# Patient Record
Sex: Female | Born: 1965
Health system: Southern US, Community
[De-identification: ages and names within clinical notes are randomized; demographics above are authoritative.]

## PROBLEM LIST (undated history)

## (undated) DIAGNOSIS — I10 Essential (primary) hypertension: Secondary | ICD-10-CM

## (undated) DIAGNOSIS — E119 Type 2 diabetes mellitus without complications: Secondary | ICD-10-CM

## (undated) DIAGNOSIS — F419 Anxiety disorder, unspecified: Secondary | ICD-10-CM

## (undated) HISTORY — PX: BACK SURGERY: SHX140

## (undated) HISTORY — PX: BREAST SURGERY: SHX581

---

## 2003-03-24 ENCOUNTER — Encounter: Payer: Self-pay | Admitting: Family Medicine

## 2008-03-24 ENCOUNTER — Encounter: Payer: Self-pay | Admitting: Family Medicine

## 2008-04-04 ENCOUNTER — Encounter: Payer: Self-pay | Admitting: Family Medicine

## 2009-01-15 ENCOUNTER — Encounter: Payer: Self-pay | Admitting: Family Medicine

## 2009-01-15 ENCOUNTER — Other Ambulatory Visit: Admission: RE | Admit: 2009-01-15 | Discharge: 2009-01-15 | Payer: Self-pay | Admitting: Family Medicine

## 2009-01-15 ENCOUNTER — Ambulatory Visit: Payer: Self-pay | Admitting: Family Medicine

## 2009-01-15 DIAGNOSIS — F329 Major depressive disorder, single episode, unspecified: Secondary | ICD-10-CM | POA: Insufficient documentation

## 2009-01-16 ENCOUNTER — Encounter: Payer: Self-pay | Admitting: Family Medicine

## 2009-01-16 LAB — CONVERTED CEMR LAB
ALT: 16 units/L (ref 0–35)
AST: 15 units/L (ref 0–37)
Albumin: 4.2 g/dL (ref 3.5–5.2)
Calcium: 9.6 mg/dL (ref 8.4–10.5)
Chloride: 103 meq/L (ref 96–112)
Cholesterol, target level: 200 mg/dL
HDL goal, serum: 40 mg/dL
Potassium: 4.5 meq/L (ref 3.5–5.3)
TSH: 4.258 microintl units/mL (ref 0.350–4.500)
Total CHOL/HDL Ratio: 4

## 2009-04-25 ENCOUNTER — Ambulatory Visit: Payer: Self-pay | Admitting: Family Medicine

## 2009-04-25 ENCOUNTER — Encounter: Admission: RE | Admit: 2009-04-25 | Discharge: 2009-04-25 | Payer: Self-pay | Admitting: Family Medicine

## 2009-04-25 DIAGNOSIS — M25519 Pain in unspecified shoulder: Secondary | ICD-10-CM | POA: Insufficient documentation

## 2009-09-03 ENCOUNTER — Ambulatory Visit: Payer: Self-pay | Admitting: Family Medicine

## 2009-09-03 LAB — CONVERTED CEMR LAB
Ketones, urine, test strip: NEGATIVE
pH: 6

## 2009-09-04 ENCOUNTER — Encounter: Payer: Self-pay | Admitting: Family Medicine

## 2010-01-03 ENCOUNTER — Ambulatory Visit: Payer: Self-pay | Admitting: Family Medicine

## 2010-01-03 DIAGNOSIS — D239 Other benign neoplasm of skin, unspecified: Secondary | ICD-10-CM | POA: Insufficient documentation

## 2010-10-01 NOTE — Letter (Signed)
Summary: [facility]  [facility]   Imported By: Lanelle Bal 09/07/2009 08:46:55  _____________________________________________________________________  External Attachment:    Type:   Image     Comment:   External Document

## 2010-10-01 NOTE — Assessment & Plan Note (Signed)
Summary: REcurrent UTI   Vital Signs:  Patient profile:   45 year old female Menstrual status:  regular Height:      69 inches Weight:      217 pounds Temp:     97.9 degrees F oral Pulse rate:   117 / minute BP sitting:   124 / 74  (left arm) Cuff size:   regular  Vitals Entered By: Kathlene November (September 03, 2009 1:54 PM) CC: urinary frequency, pressure, lower pelvic pain on and off for 1 month now.   Primary Care Provider:  Nani Gasser MD  CC:  urinary frequency, pressure, and lower pelvic pain on and off for 1 month now..  History of Present Illness: urinary frequency, pressure, lower pelvic pain on and off for 1 month now. Symptomatic have been intermittant.  No fever. Used BActrim about 1.5 weeks ago and it improved but then came back.  The bactrim makes her veyr nauseated. Has a cath and Ct scan of her pelvis 6 mo ago by Urology.  Wonder if she could have Intersitial cystitis. Had been treated for 6-7 UTIs int eh last 6 months mostly by Urgent Care. Says often told her urine is clear but when still has sxs 3 days later is often treated and then does seem to get a little better. Does drink 2 sodas a day and avoids caffeine.   Current Medications (verified): 1)  Imipramine Hcl 25 Mg Tabs (Imipramine Hcl) 2)  Alprazolam 0.25 Mg Tabs (Alprazolam)  Allergies (verified): No Known Drug Allergies  Comments:  Nurse/Medical Assistant: The patient's medications and allergies were reviewed with the patient and were updated in the Medication and Allergy Lists. Kathlene November (September 03, 2009 1:59 PM)  Physical Exam  General:  Well-developed,well-nourished,in no acute distress; alert,appropriate and cooperative throughout examination. Appears fatigued.  Msk:  No CVA tenerness.  Skin:  no rashes.   Psych:  Cognition and judgment appear intact. Alert and cooperative with normal attention span and concentration. No apparent delusions, illusions, hallucinations   Impression &  Recommendations:  Problem # 1:  UTI (ICD-599.0) Will send culture. Will treat with Keflex as Cipro and Bactrim upset her stomachy. Also saw Urology and had a CT in 2009 so will call and see if we can get these records. Says she never got a call back on these tests so doesn't know if they were normal or not.  Also if normal or if culture is neg then consider referral to Dr. Logan Bores at [facility] for Interstitial cystitis work up. Avoid caffeine and soda for now. Increase water intake.  Her updated medication list for this problem includes:    Cephalexin 500 Mg Caps (Cephalexin) .Marland Kitchen... Take 1 tablet by mouth two times a day for for 3 days  Orders: UA Dipstick w/o Micro (automated)  (81003) T-Urine Culture (Spectrum Order) [id number])  Complete Medication List: 1)  Imipramine Hcl 25 Mg Tabs (Imipramine hcl) 2)  Alprazolam 0.25 Mg Tabs (Alprazolam) 3)  Cephalexin 500 Mg Caps (Cephalexin) .... Take 1 tablet by mouth two times a day for for 3 days Prescriptions: CEPHALEXIN 500 MG CAPS (CEPHALEXIN) Take 1 tablet by mouth two times a day for for 3 days  #6 x 0   Entered and Authorized by:   Nani Gasser MD   Signed by:   Nani Gasser MD on 09/03/2009   Method used:   Electronically to        [facility] [phone]* (retail)  [address], [place]  [address]       Ph: [phone] or [phone]       Fax: [phone]   RxID:   [id number]   Laboratory Results   Urine Tests  Date/Time Received: 09/03/2009 Date/Time Reported: 09/03/2009  Routine Urinalysis   Color: yellow Appearance: Clear Glucose: negative   (Normal Range: Negative) Bilirubin: negative   (Normal Range: Negative) Ketone: negative   (Normal Range: Negative) Spec. Gravity: 1.025   (Normal Range: 1.003-1.035) Blood: trace-intact   (Normal Range: Negative) pH: 6.0   (Normal Range: 5.0-8.0) Protein: trace   (Normal Range: Negative) Urobilinogen: 0.2   (Normal Range: 0-1) Nitrite: positive   (Normal  Range: Negative) Leukocyte Esterace: moderate   (Normal Range: Negative)       Appended Document: REcurrent UTI Call pt: She had a normal renal US and also saw Urology in 03/2008 over a year ago and at that time was treated with suppression.  Let pt know if would like we can refer back to Urology for possible IC. Can schedule with Dr Logan Bores at [facility] or can set her back up with Dr. Earlene Plater which is who she saw at [facility].  January  4, 20111:01 PM Shalice Woodring MD, Santina Evans  09/04/2009 @ 1:05pm-Pt notified and she will schedule her appt with Dr. Earlene Plater. KJ LPN

## 2010-10-01 NOTE — Assessment & Plan Note (Signed)
Summary: BX lesion   Vital Signs:  Patient profile:   45 year old female Menstrual status:  regular Height:      69 inches Weight:      213 pounds BMI:     31.57 Pulse rate:   106 / minute BP sitting:   112 / 79  (left arm) Cuff size:   regular  Vitals Entered By: Kathlene November (Jan 03, 2010 10:37 AM) CC: red area on lower left leg wants looked at   Primary Care Provider:  Nani Gasser MD  CC:  red area on lower left leg wants looked at.  History of Present Illness: red area on lower left leg wants looked at.    Current Medications (verified): 1)  Imipramine Hcl 25 Mg Tabs (Imipramine Hcl) 2)  Alprazolam 0.25 Mg Tabs (Alprazolam) 3)  Nefazodone Hcl 50 Mg Tabs (Nefazodone Hcl) .... Take One Tablet By Mouth Twice A Day  Allergies (verified): No Known Drug Allergies  Comments:  Nurse/Medical Assistant: The patient's medications and allergies were reviewed with the patient and were updated in the Medication and Allergy Lists. Kathlene November (Jan 03, 2010 10:40 AM)  Physical Exam  Skin:  1.1 x 0.8  cm pink papule on her left inner lower leg. NO surrounding erythema. WEll demarcated borders. No dryness or scaling.    Impression & Recommendations:  Problem # 1:  NEVUS, ATYPICAL (ICD-216.9)  Possible seb keratosis but sig increase in size over one month.  Bx sent. Will call with results.   Orders: Shave Skin Lesion 1.1-2.0 cm/trunk/arm/leg ([address])  Complete Medication List: 1)  Imipramine Hcl 25 Mg Tabs (Imipramine hcl) 2)  Alprazolam 0.25 Mg Tabs (Alprazolam) 3)  Nefazodone Hcl 50 Mg Tabs (Nefazodone hcl) .... Take one tablet by mouth twice a day    Procedure Note Last Tetanus: Tdap (01/15/2009)  Biopsy: Biopsy Type: Skin The patient complains of redness, changing mole, and suspicious lesion. Indication: changing lesion  Procedure # 1: shave biopsy    Size (in cm): 1.2 x 0.8    Region: left medial lower leg.     Comment: Lidocaine w/ epi [id number]  Exp.  12/2010 aluminum chloride used to reach hemostasis.     Instrument used: double blade.     Anesthesia: 1% lidocaine w/epinephrine  Cleaned and prepped with: alcohol and betadine Wound dressing: bacitracin Additional Instructions: F/U wound care reviewdd.

## 2010-10-13 ENCOUNTER — Ambulatory Visit (INDEPENDENT_AMBULATORY_CARE_PROVIDER_SITE_OTHER): Payer: BC Managed Care – PPO | Admitting: Emergency Medicine

## 2010-10-13 ENCOUNTER — Encounter: Payer: Self-pay | Admitting: Emergency Medicine

## 2010-10-13 DIAGNOSIS — J209 Acute bronchitis, unspecified: Secondary | ICD-10-CM | POA: Insufficient documentation

## 2010-10-13 LAB — CONVERTED CEMR LAB: Rapid Strep: NEGATIVE

## 2010-10-23 NOTE — Assessment & Plan Note (Signed)
Summary: SICK(rm5)    Vital Signs:  Patient Profile:   45 Years Old Female CC:      sinus problems, sore throat, cough Height:     69 inches Weight:      220 pounds O2 Sat:      99 % O2 treatment:    Room Air Temp:     98.7 degrees F oral Pulse rate:   95 / minute Resp:     20 per minute BP sitting:   143 / 82  (left arm) Cuff size:   large  Vitals Entered By: Lajean Saver RN (October 13, 2010 4:04 PM)                  Updated Prior Medication List: ALPRAZOLAM 0.25 MG TABS (ALPRAZOLAM)  ZOLOFT 100 MG TABS (SERTRALINE HCL) take 4 daily  Current Allergies: No known allergies History of Present Illness Chief Complaint: sinus problems, sore throat, cough History of Present Illness: Pt complains of 10 days of congestion.  No sore throat. + cough, No dyspnea. No chest pain. No wheezing.  No nausea No vomiting. No fever, No chills   REVIEW OF SYSTEMS Constitutional Symptoms       Complains of fever.     Denies chills, night sweats, weight loss, weight gain, and fatigue.  Eyes       Denies change in vision, eye pain, eye discharge, glasses, contact lenses, and eye surgery. Ear/Nose/Throat/Mouth       Complains of ear pain and sore throat.      Denies hearing loss/aids, change in hearing, ear discharge, dizziness, frequent runny nose, frequent nose bleeds, sinus problems, hoarseness, and tooth pain or bleeding.  Respiratory       Complains of productive cough.      Denies dry cough, wheezing, shortness of breath, asthma, bronchitis, and emphysema/COPD.  Cardiovascular       Denies murmurs, chest pain, and tires easily with exhertion.    Gastrointestinal       Denies stomach pain, nausea/vomiting, diarrhea, constipation, blood in bowel movements, and indigestion. Genitourniary       Denies painful urination, kidney stones, and loss of urinary control. Neurological       Denies paralysis, seizures, and fainting/blackouts. Musculoskeletal       Denies muscle pain,  joint pain, joint stiffness, decreased range of motion, redness, swelling, muscle weakness, and gout.  Skin       Denies bruising, unusual mles/lumps or sores, and hair/skin or nail changes.  Psych       Denies mood changes, temper/anger issues, anxiety/stress, speech problems, depression, and sleep problems.  Past History:  Past Medical History: Reviewed history from 01/15/2009 and no changes required. Psych- Dr. Derrek Gu.   Past Surgical History: Reviewed history from 01/15/2009 and no changes required. Low BAck Fusion  Family History: Reviewed history from 01/15/2009 and no changes required. FAther with lung CA Daughter with depression GM and uncle with DM.   Social History: Married to [place] with 3 kids.   Former Smoker, quit 2002 Regular exercise-no Alcohol use-no Drug use-no Drug Use:  no Physical Exam General appearance: well developed, well nourished, no acute distress Head: normocephalic, atraumatic Eyes: conjunctivae and lids normal Ears: normal, no lesions or deformities Nasal: pale, boggy, swollen nasal turbinates Oral/Pharynx: tongue normal, posterior pharynx without erythema or exudate Neck: neck supple,  trachea midline, no masses Chest/Lungs: scattered rhonchi. No rales or wheezes Heart: regular rate and  rhythm, no murmur Skin: no obvious rashes or  lesions Assessment New Problems: ACUTE BRONCHITIS (ICD-466.0)   Patient Education: Patient and/or caregiver instructed in the following: rest, fluids, Tylenol prn.  Plan New Medications/Changes: ZITHROMAX Z-PAK 250 MG TABS (AZITHROMYCIN) take z-pak as directed  #1 pack x 0, 10/13/2010, Lajean Manes MD  New Orders: New Patient Level II [id number] Planning Comments:   Other sxs care discussed  Follow Up: Follow up in 2-3 days if no improvement, Follow up with Primary Physician  The patient and/or caregiver has been counseled thoroughly with regard to medications prescribed including dosage, schedule,  interactions, rationale for use, and possible side effects and they verbalize understanding.  Diagnoses and expected course of recovery discussed and will return if not improved as expected or if the condition worsens. Patient and/or caregiver verbalized understanding.  Prescriptions: ZITHROMAX Z-PAK 250 MG TABS (AZITHROMYCIN) take z-pak as directed  #1 pack x 0   Entered and Authorized by:   Lajean Manes MD   Signed by:   Lajean Manes MD on 10/13/2010   Method used:   Electronically to        [facility] [phone]* (retail)       [address] [place], [place]  [address]       Ph: [phone]       Fax: [phone]   RxID:   [id number]   Orders Added: 1)  New Patient Level II [99202]    Laboratory Results  Date/Time Received: October 13, 2010 4:30 PM  Date/Time Reported: October 13, 2010 4:30 PM   Other Tests  Rapid Strep: negative  Kit Test Internal QC: Negative   (Normal Range: Negative)

## 2011-04-24 ENCOUNTER — Encounter: Payer: Self-pay | Admitting: Family Medicine

## 2011-04-24 ENCOUNTER — Inpatient Hospital Stay (INDEPENDENT_AMBULATORY_CARE_PROVIDER_SITE_OTHER)
Admission: RE | Admit: 2011-04-24 | Discharge: 2011-04-24 | Disposition: A | Discharge status: Home / Self Care | Payer: BC Managed Care – PPO | Source: Ambulatory Visit | Attending: Family Medicine | Admitting: Family Medicine

## 2011-04-24 DIAGNOSIS — J029 Acute pharyngitis, unspecified: Secondary | ICD-10-CM | POA: Insufficient documentation

## 2011-04-24 DIAGNOSIS — H6980 Other specified disorders of Eustachian tube, unspecified ear: Secondary | ICD-10-CM

## 2011-04-24 DIAGNOSIS — J00 Acute nasopharyngitis [common cold]: Secondary | ICD-10-CM

## 2011-06-03 ENCOUNTER — Encounter: Payer: Self-pay | Admitting: Family Medicine

## 2011-06-06 ENCOUNTER — Encounter: Payer: Self-pay | Admitting: Family Medicine

## 2011-06-06 ENCOUNTER — Inpatient Hospital Stay (INDEPENDENT_AMBULATORY_CARE_PROVIDER_SITE_OTHER)
Admission: RE | Admit: 2011-06-06 | Discharge: 2011-06-06 | Disposition: A | Discharge status: Home / Self Care | Payer: BC Managed Care – PPO | Source: Ambulatory Visit | Attending: Family Medicine | Admitting: Family Medicine

## 2011-06-06 DIAGNOSIS — H109 Unspecified conjunctivitis: Secondary | ICD-10-CM

## 2011-06-06 DIAGNOSIS — J069 Acute upper respiratory infection, unspecified: Secondary | ICD-10-CM

## 2011-08-04 NOTE — Progress Notes (Signed)
Summary: Sore throat   Vital Signs:  Patient Profile:   45 Years Old Female CC:      sore throat x 1 day Height:     69 inches Weight:      217.50 pounds O2 Sat:      99 % O2 treatment:    Room Air Temp:     98.7 degrees F oral Pulse rate:   103 / minute Resp:     18 per minute BP sitting:   115 / 74  (left arm) Cuff size:   regular  Vitals Entered By: Clemens Catholic LPN (April 24, 2011 4:25 PM)                  Prior Medication List:  ALPRAZOLAM 0.25 MG TABS (ALPRAZOLAM)  ZOLOFT 100 MG TABS (SERTRALINE HCL) take 4 daily ZITHROMAX Z-PAK 250 MG TABS (AZITHROMYCIN) take z-pak as directed   Updated Prior Medication List: ALPRAZOLAM 0.25 MG TABS (ALPRAZOLAM)  PAXIL 20 MG TABS (PAROXETINE HCL)   Current Allergies (reviewed today): ! AMOXICILLINHistory of Present Illness Chief Complaint: sore throat x 1 day History of Present Illness: Patient w/off and on sore throat since last Thursday coming back from the beach. She reports things had gotten better and then yesterday she felt as if her throat was closing over when she slept. She reports some R ear discomfort and both her and her husband reports that her uvula was swollen this AM.   Current Problems: ACUTE PHARYNGITIS (ICD-462) EUSTACHIAN TUBE DYSFUNCTION, RIGHT (ICD-381.81) ACUTE NASOPHARYNGITIS (ICD-460) ACUTE BRONCHITIS (ICD-466.0) NEVUS, ATYPICAL (ICD-216.9) SHOULDER PAIN, RIGHT (ICD-719.41) DEPRESSION (ICD-311) ROUTINE GYNECOLOGICAL EXAMINATION (ICD-V72.31)   Current Meds ALPRAZOLAM 0.25 MG TABS (ALPRAZOLAM)  PAXIL 20 MG TABS (PAROXETINE HCL)  ALLEGRA-D ALLERGY & CONGESTION 180-240 MG XR24H-TAB (FEXOFENADINE-PSEUDOEPHEDRINE) 1 by mouth q day FLONASE 50 MCG/ACT SUSP (FLUTICASONE PROPIONATE) 2 puffs each nostril q day  REVIEW OF SYSTEMS Constitutional Symptoms      Denies fever, chills, night sweats, weight loss, weight gain, and fatigue.  Eyes       Denies change in vision, eye pain, eye discharge,  glasses, contact lenses, and eye surgery. Ear/Nose/Throat/Mouth       Complains of sore throat.      Denies hearing loss/aids, change in hearing, ear pain, ear discharge, dizziness, frequent runny nose, frequent nose bleeds, sinus problems, hoarseness, and tooth pain or bleeding.  Respiratory       Denies dry cough, productive cough, wheezing, shortness of breath, asthma, bronchitis, and emphysema/COPD.  Cardiovascular       Denies murmurs, chest pain, and tires easily with exhertion.    Gastrointestinal       Denies stomach pain, nausea/vomiting, diarrhea, constipation, blood in bowel movements, and indigestion. Genitourniary       Denies painful urination, kidney stones, and loss of urinary control. Neurological       Denies paralysis, seizures, and fainting/blackouts. Musculoskeletal       Denies muscle pain, joint pain, joint stiffness, decreased range of motion, redness, swelling, muscle weakness, and gout.  Skin       Denies bruising, unusual mles/lumps or sores, and hair/skin or nail changes.  Psych       Denies mood changes, temper/anger issues, anxiety/stress, speech problems, depression, and sleep problems. Other Comments: the pt states that she had a sore throat 1 wk ago which resolved. yesterday she developed a sore throat, and swelling again. she has taken tylenol cold. no fever.   Past History:  Family History:  Last updated: 01/15/2009 FAther with lung CA Daughter with depression GM and uncle with DM.   Social History: Last updated: 10/13/2010 Married to [place] with 3 kids.   Former Smoker, quit 2002 Regular exercise-no Alcohol use-no Drug use-no  Risk Factors: Exercise: no (01/15/2009)  Risk Factors: Smoking Status: quit (01/15/2009)  Past Medical History: Reviewed history from 01/15/2009 and no changes required. Psych- Dr. Derrek Gu.   Past Surgical History: Reviewed history from 01/15/2009 and no changes required. Low BAck Fusion  Family History: Reviewed  history from 01/15/2009 and no changes required. FAther with lung CA Daughter with depression GM and uncle with DM.   Social History: Reviewed history from 10/13/2010 and no changes required. Married to [place] with 3 kids.   Former Smoker, quit 2002 Regular exercise-no Alcohol use-no Drug use-no Physical Exam General appearance: obese, well developed, well nourished, no acute distress Head: normocephalic, atraumatic Ears: normal, no lesions or deformities tenderness over the r Posterior aarea below the ear Nasal: swollen red turbinates with congestion Oral/Pharynx: pharyngeal erythema without exudate, uvula midline without deviation Neck: supple,anterior lymphadenopathy present Skin: no obvious rashes or lesions MSE: oriented to time, place, and person Assessment Problems:   ACUTE BRONCHITIS (ICD-466.0) NEVUS, ATYPICAL (ICD-216.9) SHOULDER PAIN, RIGHT (ICD-719.41) DEPRESSION (ICD-311) ROUTINE GYNECOLOGICAL EXAMINATION (ICD-V72.31) New Problems: ACUTE PHARYNGITIS (ICD-462) EUSTACHIAN TUBE DYSFUNCTION, RIGHT (ICD-381.81) ACUTE NASOPHARYNGITIS (ICD-460)   Patient Education: Patient and/or caregiver instructed in the following: rest, fluids.  Plan New Medications/Changes: FLONASE 50 MCG/ACT SUSP (FLUTICASONE PROPIONATE) 2 puffs each nostril q day  #1 x 0, 04/24/2011, Hassan Rowan MD ALLEGRA-D ALLERGY & CONGESTION 180-240 MG XR24H-TAB (FEXOFENADINE-PSEUDOEPHEDRINE) 1 by mouth q day  #30 x 0, 04/24/2011, Hassan Rowan MD  New Orders: Est. Patient Level III [16109] T-Culture, Throat [60454-09811] Rapid Strep [91478] Follow Up: Follow up in 2-3 days if no improvement, Follow up on an as needed basis, Follow up with Primary Physician  The patient and/or caregiver has been counseled thoroughly with regard to medications prescribed including dosage, schedule, interactions, rationale for use, and possible side effects and they verbalize understanding.  Diagnoses and expected course of  recovery discussed and will return if not improved as expected or if the condition worsens. Patient and/or caregiver verbalized understanding.  Prescriptions: FLONASE 50 MCG/ACT SUSP (FLUTICASONE PROPIONATE) 2 puffs each nostril q day  #1 x 0   Entered and Authorized by:   Hassan Rowan MD   Signed by:   Hassan Rowan MD on 04/24/2011   Method used:   Printed then faxed to ...       [facility] [phone]* (retail)       [address] [place], [place]  [address]       Ph: [phone]       Fax: [phone]   RxID:   [id number] ALLEGRA-D ALLERGY & CONGESTION 180-240 MG XR24H-TAB (FEXOFENADINE-PSEUDOEPHEDRINE) 1 by mouth q day  #30 x 0   Entered and Authorized by:   Hassan Rowan MD   Signed by:   Hassan Rowan MD on 04/24/2011   Method used:   Printed then faxed to ...       [facility] [phone]* (retail)       [address] [place], [place]  [address]       Ph: [phone]       Fax: [phone]   RxID:   [id number]   Patient Instructions:  1)  Please schedule a follow-up appointment as needed. 2)  Please schedule an appointment with your primary doctor in :5-7 days 3)  Acute sinusitis symptoms for less than 10 days are not helped by antibiotics.Use warm moist compresses, and over the counter decongestants ( only as directed). Call if no improvement in 5-7 days, sooner if increasing pain, fever, or new symptoms.  Orders Added: 1)  Est. Patient Level III [99213] 2)  T-Culture, Throat [[id number]] 3)  Rapid Strep [14782]    Laboratory Results  Date/Time Received: April 24, 2011 4:46 PM  Date/Time Reported: April 24, 2011 4:46 PM   Other Tests  Rapid Strep: negative  Kit Test Internal QC: Negative   (Normal Range: Negative)

## 2011-08-04 NOTE — Progress Notes (Signed)
Summary: Possible Pink Eye (L) Room 3   Vital Signs:  Patient Profile:   45 Years Old Female CC:      Left eye burning, crusty since this morning Height:     69 inches Weight:      219 pounds O2 Sat:      99 % O2 treatment:    Room Air Temp:     98.7 degrees F oral Pulse rate:   97 / minute Pulse rhythm:   regular Resp:     16 per minute BP sitting:   119 / 80  (left arm) Cuff size:   large  Vitals Entered By: Emilio Math (June 06, 2011 8:58 AM)                  Current Allergies (reviewed today): ! AMOXICILLINHistory of Present Illness Chief Complaint: Left eye burning, crusty since this morning History of Present Illness: Patient has had a URI for several days bur awoke this AM w/L eye crusted over and stinging.  Current Meds ALPRAZOLAM 0.25 MG TABS (ALPRAZOLAM)  PAXIL 20 MG TABS (PAROXETINE HCL)  ALLEGRA-D ALLERGY & CONGESTION 180-240 MG XR24H-TAB (FEXOFENADINE-PSEUDOEPHEDRINE) 1 by mouth q day FLONASE 50 MCG/ACT SUSP (FLUTICASONE PROPIONATE) 2 puffs each nostril q day GENTAMICIN SULFATE 0.3 % SOLN (GENTAMICIN SULFATE) 2 drops affected eye 3-4 x a day next 4-7 days  REVIEW OF SYSTEMS Constitutional Symptoms      Denies fever, chills, night sweats, weight loss, weight gain, and fatigue.  Eyes       Complains of eye pain and eye drainage.      Denies change in vision, glasses, contact lenses, and eye surgery. Ear/Nose/Throat/Mouth       Denies hearing loss/aids, change in hearing, ear pain, ear discharge, dizziness, frequent runny nose, frequent nose bleeds, sinus problems, sore throat, hoarseness, and tooth pain or bleeding.  Respiratory       Denies dry cough, productive cough, wheezing, shortness of breath, asthma, bronchitis, and emphysema/COPD.  Cardiovascular       Denies murmurs, chest pain, and tires easily with exhertion.    Gastrointestinal       Denies stomach pain, nausea/vomiting, diarrhea, constipation, blood in bowel movements, and  indigestion. Genitourniary       Denies painful urination, kidney stones, and loss of urinary control. Neurological       Denies paralysis, seizures, and fainting/blackouts. Musculoskeletal       Denies muscle pain, joint pain, joint stiffness, decreased range of motion, redness, swelling, muscle weakness, and gout.  Skin       Denies bruising, unusual mles/lumps or sores, and hair/skin or nail changes.  Psych       Denies mood changes, temper/anger issues, anxiety/stress, speech problems, depression, and sleep problems.  Past History:  Family History: Last updated: 01/15/2009 FAther with lung CA Daughter with depression GM and uncle with DM.   Social History: Last updated: 06/06/2011 Married to [place] with 3 kids.   Former Smoker, quit 2002 Regular exercise-no Alcohol use-no Drug use-no Nanny  Risk Factors: Exercise: no (01/15/2009)  Risk Factors: Smoking Status: quit (01/15/2009)  Past Medical History: Reviewed history from 01/15/2009 and no changes required. Psych- Dr. Derrek Gu.   Past Surgical History: Reviewed history from 01/15/2009 and no changes required. Low BAck Fusion  Family History: Reviewed history from 01/15/2009 and no changes required. FAther with lung CA Daughter with depression GM and uncle with DM.   Social History: Reviewed history from 10/13/2010 and no changes  required. Married to [place] with 3 kids.   Former Smoker, quit 2002 Regular exercise-no Alcohol use-no Drug use-no Nanny Physical Exam General appearance: well developed, well nourished, no acute distress Head: normocephalic, atraumatic Eyes: injected conjunctivae Ears: normal, no lesions or deformities Oral/Pharynx: tongue normal, posterior pharynx without erythema or exudate Neck: neck supple,  trachea midline, no masses Skin: no obvious rashes or lesions MSE: oriented to time, place, and person Assessment Problems:   ACUTE PHARYNGITIS (ICD-462) EUSTACHIAN TUBE DYSFUNCTION,  RIGHT (ICD-381.81) ACUTE BRONCHITIS (ICD-466.0) NEVUS, ATYPICAL (ICD-216.9) SHOULDER PAIN, RIGHT (ICD-719.41) DEPRESSION (ICD-311) ROUTINE GYNECOLOGICAL EXAMINATION (ICD-V72.31) New Problems: UPPER RESPIRATORY INFECTION, ACUTE (ICD-465.9) CONJUNCTIVITIS (ICD-372.30)  conjunctivitiis  Patient Education: Patient and/or caregiver instructed in the following: rest fluids and Tylenol.  Plan New Medications/Changes: GENTAMICIN SULFATE 0.3 % SOLN (GENTAMICIN SULFATE) 2 drops affected eye 3-4 x a day next 4-7 days  #1 x 0, 06/06/2011, Hassan Rowan MD  New Orders: Est. Patient Level III [16109] Follow Up: Follow up in 2-3 days if no improvement, Follow up on an as needed basis, Follow up with Primary Physician  The patient and/or caregiver has been counseled thoroughly with regard to medications prescribed including dosage, schedule, interactions, rationale for use, and possible side effects and they verbalize understanding.  Diagnoses and expected course of recovery discussed and will return if not improved as expected or if the condition worsens. Patient and/or caregiver verbalized understanding.  Prescriptions: GENTAMICIN SULFATE 0.3 % SOLN (GENTAMICIN SULFATE) 2 drops affected eye 3-4 x a day next 4-7 days  #1 x 0   Entered and Authorized by:   Hassan Rowan MD   Signed by:   Hassan Rowan MD on 06/06/2011   Method used:   Printed then faxed to ...       [facility] [phone]* (retail)       [address] [place], [place]  [address]       Ph: [phone]       Fax: [phone]   RxID:   [id number]   Patient Instructions: 1)  Please schedule a follow-up appointment as needed. 2)  Please schedule an appointment with your primary doctor in :7-10  3)  Clean any discharge from eyelids with baby shampoo and warm water. Be sure to wash your hands often  to avoid spreading and reinfection. If you wear contacts, remove them and wear glasses until infection resolved (be sure and clean  lenses before replacing).   Orders Added: 1)  Est. Patient Level III [02725]

## 2011-09-18 ENCOUNTER — Emergency Department (INDEPENDENT_AMBULATORY_CARE_PROVIDER_SITE_OTHER)
Admission: EM | Admit: 2011-09-18 | Discharge: 2011-09-18 | Disposition: A | Discharge status: Home / Self Care | Payer: BC Managed Care – PPO | Source: Home / Self Care

## 2011-09-18 ENCOUNTER — Encounter: Payer: Self-pay | Admitting: *Deleted

## 2011-09-18 DIAGNOSIS — J069 Acute upper respiratory infection, unspecified: Secondary | ICD-10-CM

## 2011-09-18 DIAGNOSIS — R059 Cough, unspecified: Secondary | ICD-10-CM

## 2011-09-18 DIAGNOSIS — R05 Cough: Secondary | ICD-10-CM

## 2011-09-18 HISTORY — DX: Anxiety disorder, unspecified: F41.9

## 2011-09-18 MED ORDER — GUAIFENESIN-CODEINE 100-10 MG/5ML PO SYRP: 5.0000 mL | ORAL_SOLUTION | Freq: Four times a day (QID) | ORAL | Status: DC | PRN

## 2011-09-18 MED ORDER — AZITHROMYCIN 250 MG PO TABS: ORAL_TABLET | ORAL | Status: AC

## 2011-09-18 NOTE — ED Provider Notes (Signed)
History     CSN: [id number]  Arrival date & time 09/18/11  1525   None     Chief Complaint  Patient presents with  . Cough  . Headache    (Consider location/radiation/quality/duration/timing/severity/associated sxs/prior treatment) HPI Jennifer Gray is a 46 y.o. female who complains of onset of cold symptoms for 10-14 days.  + sore throat + cough No pleuritic pain No wheezing + nasal congestion + post-nasal drainage + sinus pain/pressure No chest congestion No itchy/red eyes No earache No hemoptysis No SOB No chills/sweats No fever No nausea No vomiting No abdominal pain No diarrhea No skin rashes No fatigue No myalgias + headache    Past Medical History  Diagnosis Date  . Anxiety     Past Surgical History  Procedure Date  . Back surgery   . Breast surgery     History reviewed. No pertinent family history.  History  Substance Use Topics  . Smoking status: Former Smoker    Quit date: 09/17/2006  . Smokeless tobacco: Not on file  . Alcohol Use: No    OB History    Grav Para Term Preterm Abortions TAB SAB Ect Mult Living                  Review of Systems  Allergies  Amoxicillin and Vicodin  Home Medications   Current Outpatient Rx  Name Route Sig Dispense Refill  . PAROXETINE HCL 10 MG PO TABS Oral Take 10 mg by mouth every morning.    . AZITHROMYCIN 250 MG PO TABS  Use as directed 1 each 0  . GUAIFENESIN-CODEINE 100-10 MG/5ML PO SYRP Oral Take 5 mLs by mouth 4 (four) times daily as needed for cough or congestion. 120 mL 0    BP 130/83  Pulse 116  Temp(Src) 98.7 F (37.1 C) (Oral)  Resp 18  Ht 5\' 9"  (1.753 m)  Wt 222 lb (100.699 kg)  BMI 32.78 kg/m2  SpO2 98%  LMP 09/11/2011  Physical Exam  Nursing note and vitals reviewed. Constitutional: She is oriented to person, place, and time. She appears well-developed and well-nourished.  HENT:  Head: Normocephalic and atraumatic.  Right Ear: Tympanic membrane, external ear and ear  canal normal.  Left Ear: Tympanic membrane, external ear and ear canal normal.  Nose: Mucosal edema and rhinorrhea present.  Mouth/Throat: Posterior oropharyngeal erythema present. No oropharyngeal exudate or posterior oropharyngeal edema.  Eyes: No scleral icterus.  Neck: Neck supple.  Cardiovascular: Regular rhythm and normal heart sounds.   Pulmonary/Chest: Effort normal and breath sounds normal. No respiratory distress.  Neurological: She is alert and oriented to person, place, and time.  Skin: Skin is warm and dry.  Psychiatric: She has a normal mood and affect. Her speech is normal.    ED Course  Procedures (including critical care time)  Labs Reviewed - No data to display No results found.   1. Cough   2. Acute upper respiratory infections of unspecified site       MDM  1)  Take the prescribed antibiotic as instructed.  I have given her a prescription for cough medicine, but if it gives her a headache like codeine has in the past, and I have advised her just to take over-the-counter cough medicine. If she is not improving in about 4 or 5 days, she will call back and we will call in a prescription for a six-day 10 mg prednisone pack. 2)  Use nasal saline solution (over the counter) at least 3  times a day. 3)  Use over the counter decongestants like Zyrtec-D every 12 hours as needed to help with congestion.  If you have hypertension, do not take medicines with sudafed.  4)  Can take tylenol every 6 hours or motrin every 8 hours for pain or fever. 5)  Follow up with your primary doctor if no improvement in 5-7 days, sooner if increasing pain, fever, or new symptoms.        Lily Kocher, MD 09/18/11 [phone]

## 2011-09-18 NOTE — ED Notes (Signed)
Pt c/o cough and HA x 09/02/11, HA/sinus pressure worse today. She has taken mucinex and vicks with no relief.

## 2011-11-17 ENCOUNTER — Ambulatory Visit (INDEPENDENT_AMBULATORY_CARE_PROVIDER_SITE_OTHER): Payer: BC Managed Care – PPO | Admitting: Physician Assistant

## 2011-11-17 ENCOUNTER — Ambulatory Visit: Payer: BC Managed Care – PPO | Admitting: Physician Assistant

## 2011-11-17 ENCOUNTER — Encounter: Payer: Self-pay | Admitting: Physician Assistant

## 2011-11-17 VITALS — BP 136/76 | HR 92 | Temp 98.5°F | Ht 69.0 in | Wt 226.0 lb

## 2011-11-17 DIAGNOSIS — R300 Dysuria: Secondary | ICD-10-CM

## 2011-11-17 DIAGNOSIS — J40 Bronchitis, not specified as acute or chronic: Secondary | ICD-10-CM

## 2011-11-17 DIAGNOSIS — M549 Dorsalgia, unspecified: Secondary | ICD-10-CM

## 2011-11-17 LAB — POCT URINALYSIS DIPSTICK
Bilirubin, UA: NEGATIVE
Ketones, UA: NEGATIVE
Leukocytes, UA: NEGATIVE
pH, UA: 6

## 2011-11-17 MED ORDER — ALBUTEROL SULFATE (2.5 MG/3ML) 0.083% IN NEBU: 2.5000 mg | INHALATION_SOLUTION | Freq: Four times a day (QID) | RESPIRATORY_TRACT | Status: DC | PRN

## 2011-11-17 MED ORDER — METHYLPREDNISOLONE 4 MG PO KIT: PACK | ORAL | Status: AC

## 2011-11-17 NOTE — Patient Instructions (Addendum)
Stay hydrated. Use cough syrup that was given at earlier visit for cough. Albuterol nebulizer solution given to use every 6 hours. Medrol dose pack.  Call office if not improving after finishing steroid.

## 2011-11-17 NOTE — Progress Notes (Signed)
  Subjective:    Patient ID: Jennifer Gray, female    DOB: 1966/05/15, 46 y.o.   MRN: [id number]  HPI Patient presents to the clinic because she has not felt well for 4 days. She denies any fever, chills, sore throat, nausea or vomiting. She states that her chest is very tight and it hurts when she has to cough. She has a dry cough that she's not able to get anything up with. She did use her husband albuterol and felt some better and last night. She is short of breath but she denies any wheezing. She does have sinus congestion and headache along with right ear pain. The cough is much worse at night.she does have some mid back pain that is worse when she coughs. She denies any pain with urination, discharge, or blood in urine.the only thing she is taking is Tylenol cold and sinus which has not helped very much at all. She was just recently seen for sinusitis and finished Augmentin for 10 days.   Review of Systems     Objective:   Physical Exam  Constitutional: She is oriented to person, place, and time. She appears well-developed and well-nourished.  HENT:  Head: Normocephalic and atraumatic.  Right Ear: External ear normal.  Left Ear: External ear normal.  Mouth/Throat: No oropharyngeal exudate.       Right TM was clear but air bubbles. Left TM was normal. Oropharynx is red and irritated. Bilateral turbinates were red and swollen.  Eyes: Conjunctivae are normal.  Neck: Normal range of motion.       Bilateral superficial anterior lymph nodes are swollen and tender to palpation.  Cardiovascular: Normal rate, regular rhythm and normal heart sounds.   Pulmonary/Chest: Effort normal.       Decreased after intercourse breath sounds heard throughout both lung. Negative for wheezing.  No CVA tenderness.  Lymphadenopathy:    She has cervical adenopathy.  Neurological: She is alert and oriented to person, place, and time.  Skin: Skin is warm and dry.  Psychiatric: She has a normal mood and  affect. Her behavior is normal.          Assessment & Plan:  Bronchitis- Stay hydrated. Use cough syrup that was given at earlier visit for cough. Albuterol nebulizer solution given to use every 6 hours. Medrol dose pack.  Call office if not improving after finishing steroid.  Mid-back pain- UA positive for blood. Will recheck in 2 weeks. Not likely due to infection. Patient has not had any fever or pain with urination.

## 2011-12-01 ENCOUNTER — Encounter: Payer: Self-pay | Admitting: Physician Assistant

## 2011-12-01 ENCOUNTER — Ambulatory Visit (INDEPENDENT_AMBULATORY_CARE_PROVIDER_SITE_OTHER): Payer: BC Managed Care – PPO | Admitting: Physician Assistant

## 2011-12-01 VITALS — BP 140/79 | HR 101 | Temp 99.1°F | Wt 225.0 lb

## 2011-12-01 DIAGNOSIS — R05 Cough: Secondary | ICD-10-CM

## 2011-12-01 DIAGNOSIS — J329 Chronic sinusitis, unspecified: Secondary | ICD-10-CM

## 2011-12-01 DIAGNOSIS — R059 Cough, unspecified: Secondary | ICD-10-CM

## 2011-12-01 MED ORDER — GUAIFENESIN-CODEINE 100-10 MG/5ML PO SYRP: 5.0000 mL | ORAL_SOLUTION | Freq: Every evening | ORAL | Status: AC | PRN

## 2011-12-01 MED ORDER — AZITHROMYCIN 250 MG PO TABS: ORAL_TABLET | ORAL | Status: AC

## 2011-12-01 NOTE — Progress Notes (Signed)
  Subjective:    Patient ID: Jennifer Gray, female    DOB: 04-07-1966, 46 y.o.   MRN: [id number]  Sinusitis This is a new problem. The current episode started 1 to 4 weeks ago. The problem has been gradually worsening since onset. The maximum temperature recorded prior to her arrival was 100 - 100.9 F. The fever has been present for 1 to 2 days. Her pain is at a severity of 6/10. The pain is moderate. Associated symptoms include chills, congestion, coughing, headaches, sinus pressure and swollen glands. Pertinent negatives include no diaphoresis, ear pain, neck pain, shortness of breath, sneezing or sore throat. Past treatments include acetaminophen and oral decongestants (Had bronchitis and was treated with steriod about 3 weeks ago. ). The treatment provided mild relief.     Review of Systems  Constitutional: Positive for chills. Negative for diaphoresis.  HENT: Positive for congestion and sinus pressure. Negative for ear pain, sore throat, sneezing and neck pain.   Respiratory: Positive for cough. Negative for shortness of breath.   Neurological: Positive for headaches.       Objective:   Physical Exam  Constitutional: She is oriented to person, place, and time. She appears well-developed and well-nourished.  HENT:  Head: Normocephalic and atraumatic.  Right Ear: External ear normal.  Left Ear: External ear normal.       TMs normal bilaterally. Maxillary tenderness with palpation bilaterally along with frontal sinus tenderness. Oropharynx clear to mattress without exudate.  Eyes: Conjunctivae are normal.  Neck:       Superficial anterior cervical adenopathy bilaterally without tenderness to palpation.  Cardiovascular: Regular rhythm and normal heart sounds.        Bradycardia at 101.  Pulmonary/Chest: Effort normal and breath sounds normal. She has no wheezes.  Lymphadenopathy:    She has cervical adenopathy.  Neurological: She is oriented to person, place, and time.  Skin:  Skin is warm and dry.  Psychiatric: She has a normal mood and affect. Her behavior is normal.          Assessment & Plan:  Sinusitis/cough-start Z-Pak due to allergies to amoxicillin.Robitussin was refilled to use for cough at that time. Patient was encouraged to cough didn't the day and not try to suppress this. Patient was encouraged that next time she starts experiencing sinus pressure and congestion to start Mucinex twice a day with lots of water to help will loosen up the congestion.call if not improving next 3 days and we will consider a chest x-ray.  Elevated blood pressure-suspect this is due to use a decongestant and possible recent use of prednisone. We'll dilate at next visit to make sure patient blood pressure decreases.

## 2011-12-02 NOTE — Patient Instructions (Addendum)
Start Zpack. Can use cough syrup at bedtime for cough. Call office if not improving and will get a chest x-ray.

## 2012-05-13 ENCOUNTER — Encounter: Payer: Self-pay | Admitting: Family Medicine

## 2012-05-13 ENCOUNTER — Ambulatory Visit (INDEPENDENT_AMBULATORY_CARE_PROVIDER_SITE_OTHER): Payer: BC Managed Care – PPO | Admitting: Family Medicine

## 2012-05-13 VITALS — BP 147/81 | HR 94 | Temp 98.2°F | Wt 221.0 lb

## 2012-05-13 DIAGNOSIS — R21 Rash and other nonspecific skin eruption: Secondary | ICD-10-CM

## 2012-05-13 DIAGNOSIS — J029 Acute pharyngitis, unspecified: Secondary | ICD-10-CM

## 2012-05-13 DIAGNOSIS — Z23 Encounter for immunization: Secondary | ICD-10-CM

## 2012-05-13 LAB — POCT RAPID STREP A (OFFICE): Rapid Strep A Screen: NEGATIVE

## 2012-05-13 MED ORDER — MUPIROCIN 2 % EX OINT: TOPICAL_OINTMENT | Freq: Two times a day (BID) | CUTANEOUS | Status: DC

## 2012-05-13 NOTE — Progress Notes (Signed)
  Subjective:    Patient ID: Jennifer Gray, female    DOB: March 24, 1966, 46 y.o.   MRN: [id number]  HPI Throat pain started this AM.  Painufl to swallow only on her left side. No fever. Had some chills this Am. No nasal congestion.  No drip in her throat.  Thinks she probably has sleep apnea.  No GI sxs.  She has been using some over-the-counter cold medication. It did not provide relief.  Has had a rash around her mouth and nose x 1 mo. Does itch.  Trying not to scratch. Tried some rx topical erythromycin ointment on it  For several days, but no relief.  She has not tried any other treatments. It is not painful. She says occasionally they do develop a crust on them and she scratches them off.   Review of Systems     Objective:   Physical Exam  Constitutional: She is oriented to person, place, and time. She appears well-developed and well-nourished.  HENT:  Head: Normocephalic and atraumatic.  Right Ear: External ear normal.  Left Ear: External ear normal.  Nose: Nose normal.  Mouth/Throat: Oropharynx is clear and moist.       TMs and canals are clear.   Eyes: Conjunctivae normal and EOM are normal. Pupils are equal, round, and reactive to light.  Neck: Neck supple. No thyromegaly present.  Cardiovascular: Normal rate, regular rhythm and normal heart sounds.   Pulmonary/Chest: Effort normal and breath sounds normal. She has no wheezes.  Lymphadenopathy:    She has no cervical adenopathy.  Neurological: She is alert and oriented to person, place, and time.  Skin: Skin is warm and dry.       Few scattered erythematous papules. Couple have scabs in the Center. They're mostly focused on her chin and around her mouth. Nothing along the creases.  Psychiatric: She has a normal mood and affect.          Assessment & Plan:  Pharyngitis - likely viral. Her rapid strep is negative. Handout given with information. She can try that water gargles and starts in lozenges since it is early.  She's not significantly better in one week and please call the office back. I did recommend that she avoid staying around her new grandchild until she feels better.  Rash-unclear etiology as she has been scratching at them. She has noticed some crusting but she says she scratches off of them. I wonder if she could have some early impetigo some point to treat her with mupirocin ointment. If this is not helping and consider topical steroid. She will call me in one week if it is not significantly better.

## 2012-05-13 NOTE — Patient Instructions (Signed)

## 2012-09-23 ENCOUNTER — Emergency Department (INDEPENDENT_AMBULATORY_CARE_PROVIDER_SITE_OTHER): Payer: BC Managed Care – PPO

## 2012-09-23 ENCOUNTER — Emergency Department (INDEPENDENT_AMBULATORY_CARE_PROVIDER_SITE_OTHER)
Admission: EM | Admit: 2012-09-23 | Discharge: 2012-09-23 | Disposition: A | Discharge status: Home / Self Care | Payer: BC Managed Care – PPO | Source: Home / Self Care | Attending: Family Medicine | Admitting: Family Medicine

## 2012-09-23 ENCOUNTER — Encounter: Payer: Self-pay | Admitting: *Deleted

## 2012-09-23 DIAGNOSIS — J029 Acute pharyngitis, unspecified: Secondary | ICD-10-CM

## 2012-09-23 DIAGNOSIS — IMO0002 Reserved for concepts with insufficient information to code with codable children: Secondary | ICD-10-CM

## 2012-09-23 DIAGNOSIS — IMO0001 Reserved for inherently not codable concepts without codable children: Secondary | ICD-10-CM

## 2012-09-23 DIAGNOSIS — R079 Chest pain, unspecified: Secondary | ICD-10-CM

## 2012-09-23 DIAGNOSIS — M7918 Myalgia, other site: Secondary | ICD-10-CM

## 2012-09-23 DIAGNOSIS — M542 Cervicalgia: Secondary | ICD-10-CM

## 2012-09-23 DIAGNOSIS — M25519 Pain in unspecified shoulder: Secondary | ICD-10-CM

## 2012-09-23 DIAGNOSIS — M79609 Pain in unspecified limb: Secondary | ICD-10-CM

## 2012-09-23 LAB — POCT RAPID STREP A (OFFICE): Rapid Strep A Screen: NEGATIVE

## 2012-09-23 MED ORDER — PREDNISONE 20 MG PO TABS: 20.0000 mg | ORAL_TABLET | Freq: Two times a day (BID) | ORAL | Status: DC

## 2012-09-23 MED ORDER — CYCLOBENZAPRINE HCL 10 MG PO TABS: ORAL_TABLET | ORAL | Status: DC

## 2012-09-23 NOTE — ED Provider Notes (Signed)
History     CSN: [id number]  Arrival date & time 09/23/12  1412   First MD Initiated Contact with Patient 09/23/12 1413      Chief Complaint  Patient presents with  . Arm Pain    LEFT   . Sore Throat  . Shoulder Pain    LEFT      HPI Comments: Patient complains of generally constant aching in her left neck, left shoulder, left upper arm, left anterior chest, and left scapular area for about 4 days.  Symptoms improve slightly with Ibuprofen and Tylenol.  She also notes that she has had a mild sore throat for several days, and increased sinus congestion for about two weeks.  No fevers, chills, and sweats  She recalls no injury to her left shoulder/neck area, but admits that she embarked on a regular exercise program about two months ago that involves upper body muscle groups.  The history is provided by the patient and the spouse.    Past Medical History  Diagnosis Date  . Anxiety     Past Surgical History  Procedure Date  . Back surgery   . Breast surgery     History reviewed. No pertinent family history.  History  Substance Use Topics  . Smoking status: Former Smoker    Quit date: 09/17/2006  . Smokeless tobacco: Not on file  . Alcohol Use: No    OB History    Grav Para Term Preterm Abortions TAB SAB Ect Mult Living                  Review of Systems  Constitutional: Negative.   HENT: Positive for congestion, sore throat, neck pain and neck stiffness. Negative for ear pain, trouble swallowing and sinus pressure.   Eyes: Negative.   Respiratory: Negative.   Cardiovascular: Negative.   Gastrointestinal: Negative.   Genitourinary: Negative.   Musculoskeletal:       Left shoulder, neck, and chest soreness  Skin: Negative.   Neurological: Negative for headaches.    Allergies  Amoxicillin; Codeine; and Vicodin  Home Medications   Current Outpatient Rx  Name  Route  Sig  Dispense  Refill  . ALBUTEROL SULFATE (2.5 MG/3ML) 0.083% IN NEBU   Nebulization   Take 3 mLs (2.5 mg total) by nebulization every 6 (six) hours as needed for wheezing.   75 mL   3   . ALPRAZOLAM 0.5 MG PO TABS   Oral   Take 0.5 mg by mouth as needed.         . CYCLOBENZAPRINE HCL 10 MG PO TABS      Take one tab by mouth at bedtime   12 tablet   0   . MUPIROCIN 2 % EX OINT   Topical   Apply topically 2 (two) times daily.   30 g   0   . PAROXETINE HCL 10 MG PO TABS   Oral   Take 10 mg by mouth 2 (two) times daily.          Marland Kitchen PREDNISONE 20 MG PO TABS   Oral   Take 1 tablet (20 mg total) by mouth 2 (two) times daily.   10 tablet   0     BP 147/93  Pulse 96  Temp 97.6 F (36.4 C) (Oral)  Resp 18  Wt 225 lb (102.059 kg)  SpO2 99%  LMP 08/01/2012  Physical Exam Nursing notes and Vital Signs reviewed. Appearance:  Patient appears stated age, and in  no acute distress.  Patient is obese. Eyes:  Pupils are equal, round, and reactive to light and accomodation.  Extraocular movement is intact.  Conjunctivae are not inflamed  Ears:  Canals normal.  Tympanic membranes normal.  Nose:  Normal.  No sinus tenderness.   Pharynx:  Normal Neck:  Supple.  No thyromegaly.  There is distinct tenderness over bilateral shotty posterior nodes.  Diffuse tenderness over left trapezius muscle.  Slightly decreased range of motion. Lungs:  Clear to auscultation.  Breath sounds are equal. Chest:  Distinct tenderness to palpation over the mid-sternum, extending to left pectoralis muscle  Heart:  Regular rate and rhythm without murmurs, rubs, or gallops.  Abdomen:  Nontender without masses or hepatosplenomegaly.  Bowel sounds are present.  No CVA or flank tenderness.  Extremities:  No edema.  No calf tenderness.  Left shoulder has decreased range of motion to external rotation, and Apley's scratch test positive.  Rotator cuff function appears intact.  No tenderness over shoulder, including insertions of biceps tendons and sub-acromial bursa Upper back:  There is distinct  tenderness over medial edge of left scapula.  Pain elicited by resisted abduction of left shoulder while palpating left rhomboid muscles.  Skin:  No rash present.   ED Course  Procedures  none   Labs Reviewed  POCT RAPID STREP A (OFFICE) negative EKG:  No acute changes; NSR   Dg Cervical Spine 2-3 Views  09/23/2012  *RADIOLOGY REPORT*  Clinical Data: Left neck, shoulder and arm pain for 1 week.  CERVICAL SPINE - 2-3 VIEW  Comparison: No priors.  Findings: Six views of the cervical spine demonstrate no acute displaced fractures or malalignment.  There are mild multilevel degenerative changes, most pronounced in the right C4-C5 facet joints where there is some bony narrowing of the right C4-C5 neural foramen.  Prevertebral soft tissues are normal.  IMPRESSION: 1.  No acute radiographic abnormality cervical spine. 2.  Mild multilevel degenerative disc disease and cervical spondylosis, most pronounced at the right C4-C5 foramen where there is some facet hypertrophy results in some foraminal narrowing.   Original Report Authenticated By: Trudie Reed, M.D.      1. Rhomboid muscle pain and left pectoralis pain, probably a result of recent exercise regimen   2. Neck, arm, and shoulder pain on left side; consider impingement syndrome  3. Acute pharyngitis; suspect early viral URI       MDM  Begin prednisone burst.  Flexeril at bedtime. Apply ice pack two or three times daily.   Followup with Dr. Rodney Langton for further management of neck and shoulder/back pain        Lattie Haw, MD 09/24/12 1108

## 2012-09-23 NOTE — ED Notes (Addendum)
Pt c/o LT arm/shoulder pain x 3 days, denies injury. She also c/o sore throat x today. She reports that she has a hx of anxiety with increased stress currently.

## 2012-09-30 ENCOUNTER — Encounter: Payer: Self-pay | Admitting: Sports Medicine

## 2012-09-30 ENCOUNTER — Ambulatory Visit (INDEPENDENT_AMBULATORY_CARE_PROVIDER_SITE_OTHER): Payer: BC Managed Care – PPO | Admitting: Sports Medicine

## 2012-09-30 VITALS — BP 143/83 | HR 115

## 2012-09-30 DIAGNOSIS — M5412 Radiculopathy, cervical region: Secondary | ICD-10-CM

## 2012-09-30 MED ORDER — GABAPENTIN 300 MG PO CAPS: ORAL_CAPSULE | ORAL | Status: DC

## 2012-09-30 MED ORDER — PREDNISONE 50 MG PO TABS: ORAL_TABLET | ORAL | Status: DC

## 2012-09-30 NOTE — Assessment & Plan Note (Signed)
Likely left C8. Prednisone, gabapentin, formal PT. Return in one weeks, and if no better we will pull the trigger for MRI for interventional injection planning.

## 2012-09-30 NOTE — Progress Notes (Signed)
  Subjective:    I'm seeing this patient as a consultation for:  Dr. Cathren Harsh  CC: neck pain  HPI: This is a very pleasant 47 year old female with the unfortunate several week history of pain that she localizes in the left side of her neck, worse with flexion, radiating down the posterior aspect of her left arm, into her last 2 fingers of the left hand. She describes this as burning, numbness, and tingling. She was given some prednisone, and x-rays were done that showed mid cervical spine degenerative disc disease. Prednisone at 40 mg daily was moderately effective, cyclobenzaprine was ineffective. She has known degenerative disc disease and is status post multiple epidural injections as well as surgery in her lumbar spine.  Past medical history, Surgical history, Family history not pertinant except as noted below, Social history, Allergies, and medications have been entered into the medical record, reviewed, and no changes needed.   Review of Systems: No headache, visual changes, nausea, vomiting, diarrhea, constipation, dizziness, abdominal pain, skin rash, fevers, chills, night sweats, weight loss, swollen lymph nodes, body aches, joint swelling, muscle aches, chest pain, shortness of breath, mood changes, visual or auditory hallucinations.   Objective:   General: Well Developed, well nourished, and in no acute distress.  Neuro/Psych: Alert and oriented x3, extra-ocular muscles intact, able to move all 4 extremities, sensation grossly intact. Skin: Warm and dry, no rashes noted.  Respiratory: Not using accessory muscles, speaking in full sentences, trachea midline.  Cardiovascular: Pulses palpable, no extremity edema. Abdomen: Does not appear distended. Neck: Inspection unremarkable. No palpable stepoffs. Negative Spurling's maneuver. Full neck range of motion Grip strength and sensation normal in bilateral hands Strength weak in a C8 distribution on the left side. Hypoesthetic in the  left C7 and C8 distribution. Negative Hoffman sign bilaterally Reflexes normal  I reviewed her x-rays, there is moderate multilevel degenerative disc disease worse at the C4-C5 level. There's also multilevel spondylosis.  Impression and Recommendations:   This case required medical decision making of moderate complexity.

## 2012-10-06 ENCOUNTER — Ambulatory Visit: Payer: BC Managed Care – PPO | Admitting: Physical Therapy

## 2012-10-28 ENCOUNTER — Ambulatory Visit: Payer: BC Managed Care – PPO | Admitting: Sports Medicine

## 2013-05-03 ENCOUNTER — Encounter: Payer: Self-pay | Admitting: Family Medicine

## 2013-05-03 ENCOUNTER — Ambulatory Visit (INDEPENDENT_AMBULATORY_CARE_PROVIDER_SITE_OTHER): Payer: 59 | Admitting: Family Medicine

## 2013-05-03 VITALS — BP 141/87 | HR 96 | Wt 218.0 lb

## 2013-05-03 DIAGNOSIS — N6459 Other signs and symptoms in breast: Secondary | ICD-10-CM

## 2013-05-03 DIAGNOSIS — R5381 Other malaise: Secondary | ICD-10-CM

## 2013-05-03 DIAGNOSIS — M549 Dorsalgia, unspecified: Secondary | ICD-10-CM

## 2013-05-03 DIAGNOSIS — N920 Excessive and frequent menstruation with regular cycle: Secondary | ICD-10-CM

## 2013-05-03 DIAGNOSIS — J069 Acute upper respiratory infection, unspecified: Secondary | ICD-10-CM

## 2013-05-03 DIAGNOSIS — H9319 Tinnitus, unspecified ear: Secondary | ICD-10-CM

## 2013-05-03 DIAGNOSIS — N6452 Nipple discharge: Secondary | ICD-10-CM

## 2013-05-03 DIAGNOSIS — R3915 Urgency of urination: Secondary | ICD-10-CM

## 2013-05-03 DIAGNOSIS — Z1231 Encounter for screening mammogram for malignant neoplasm of breast: Secondary | ICD-10-CM

## 2013-05-03 DIAGNOSIS — H9311 Tinnitus, right ear: Secondary | ICD-10-CM

## 2013-05-03 LAB — POCT URINALYSIS DIPSTICK
Ketones, UA: NEGATIVE
Nitrite, UA: NEGATIVE
Protein, UA: NEGATIVE
Urobilinogen, UA: 0.2

## 2013-05-03 MED ORDER — CIPROFLOXACIN HCL 500 MG PO TABS: 500.0000 mg | ORAL_TABLET | Freq: Two times a day (BID) | ORAL | Status: AC

## 2013-05-03 NOTE — Patient Instructions (Signed)
Urinary Tract Infection  Urinary tract infections (UTIs) can develop anywhere along your urinary tract. Your urinary tract is your body's drainage system for removing wastes and extra water. Your urinary tract includes two kidneys, two ureters, a bladder, and a urethra. Your kidneys are a pair of bean-shaped organs. Each kidney is about the size of your fist. They are located below your ribs, one on each side of your spine.  CAUSES  Infections are caused by microbes, which are microscopic organisms, including fungi, viruses, and bacteria. These organisms are so small that they can only be seen through a microscope. Bacteria are the microbes that most commonly cause UTIs.  SYMPTOMS   Symptoms of UTIs may vary by age and gender of the patient and by the location of the infection. Symptoms in young women typically include a frequent and intense urge to urinate and a painful, burning feeling in the bladder or urethra during urination. Older women and men are more likely to be tired, shaky, and weak and have muscle aches and abdominal pain. A fever may mean the infection is in your kidneys. Other symptoms of a kidney infection include pain in your back or sides below the ribs, nausea, and vomiting.  DIAGNOSIS  To diagnose a UTI, your caregiver will ask you about your symptoms. Your caregiver also will ask to provide a urine sample. The urine sample will be tested for bacteria and white blood cells. White blood cells are made by your body to help fight infection.  TREATMENT   Typically, UTIs can be treated with medication. Because most UTIs are caused by a bacterial infection, they usually can be treated with the use of antibiotics. The choice of antibiotic and length of treatment depend on your symptoms and the type of bacteria causing your infection.  HOME CARE INSTRUCTIONS   If you were prescribed antibiotics, take them exactly as your caregiver instructs you. Finish the medication even if you feel better after you  have only taken some of the medication.   Drink enough water and fluids to keep your urine clear or pale yellow.   Avoid caffeine, tea, and carbonated beverages. They tend to irritate your bladder.   Empty your bladder often. Avoid holding urine for long periods of time.   Empty your bladder before and after sexual intercourse.   After a bowel movement, women should cleanse from front to back. Use each tissue only once.  SEEK MEDICAL CARE IF:    You have back pain.   You develop a fever.   Your symptoms do not begin to resolve within 3 days.  SEEK IMMEDIATE MEDICAL CARE IF:    You have severe back pain or lower abdominal pain.   You develop chills.   You have nausea or vomiting.   You have continued burning or discomfort with urination.  MAKE SURE YOU:    Understand these instructions.   Will watch your condition.   Will get help right away if you are not doing well or get worse.  Document Released: 05/28/2005 Document Revised: 02/17/2012 Document Reviewed: 09/26/2011  [employer] Patient Information 2014 [employer].

## 2013-05-03 NOTE — Progress Notes (Signed)
Subjective:    Patient ID: Jennifer Gray, female    DOB: February 15, 1966, 47 y.o.   MRN: [id number]  HPI Some bilat low back pain x 1 week and some pressure when urinates.  + frequency, and urgency. No blood in the urine.  No fever but hasn' tfelt well.   Bilat nipple pain x 1 mo.  Shouldn't be pregnant as husband has vasectomy. Clear discharge. No discharge.  Her periods have been heavier than usual adn have skipped some.  Last 4-6 days.  Passing larger clots and is more than used to be.  Lots of cramping. + former smoker.  No exercise or bra pain.  Last mammogram per our records was in 2012.  Swishing in her right ear. URI sxs as wel with dry cough. No hearing loss but feels a pressure sensation.  +sneezing and ST x 2 days.  Review of Systems  BP 141/87  Pulse 96  Wt 218 lb (98.884 kg)  BMI 32.18 kg/m2    Allergies  Allergen Reactions  . Amoxicillin   . Codeine     headache  . Vicodin [Hydrocodone-Acetaminophen]     Past Medical History  Diagnosis Date  . Anxiety     Past Surgical History  Procedure Laterality Date  . Back surgery    . Breast surgery      History   Social History  . Marital Status: Married    Spouse Name: N/A    Number of Children: N/A  . Years of Education: N/A   Occupational History  . Not on file.   Social History Main Topics  . Smoking status: Former Smoker    Quit date: 09/17/2006  . Smokeless tobacco: Not on file  . Alcohol Use: No  . Drug Use: No  . Sexual Activity:    Other Topics Concern  . Not on file   Social History Narrative  . No narrative on file    No family history on file.  Outpatient Encounter Prescriptions as of 05/03/2013  Medication Sig Dispense Refill  . ALPRAZolam (XANAX) 0.5 MG tablet Take 0.5 mg by mouth as needed.      Marland Kitchen PARoxetine (PAXIL) 10 MG tablet Take 10 mg by mouth 2 (two) times daily.       . ciprofloxacin (CIPRO) 500 MG tablet Take 1 tablet (500 mg total) by mouth 2 (two) times daily.  10 tablet  0   . [DISCONTINUED] cyclobenzaprine (FLEXERIL) 10 MG tablet Take one tab by mouth at bedtime  12 tablet  0  . [DISCONTINUED] gabapentin (NEURONTIN) 300 MG capsule One tab PO qHS for a week, then BID for a week, then TID.  May increase to a max of 4 tabs PO TID.  180 capsule  3  . [DISCONTINUED] predniSONE (DELTASONE) 50 MG tablet One tab PO daily for 5 days.  5 tablet  0   No facility-administered encounter medications on file as of 05/03/2013.           Objective:   Physical Exam  Constitutional: She is oriented to person, place, and time. She appears well-developed and well-nourished.  HENT:  Head: Normocephalic and atraumatic.  Right Ear: External ear normal.  Left Ear: External ear normal.  Nose: Nose normal.  Mouth/Throat: Oropharynx is clear and moist.  TMs and canals are clear.   Eyes: Conjunctivae and EOM are normal. Pupils are equal, round, and reactive to light.  Neck: Neck supple. No thyromegaly present.  Cardiovascular: Normal rate, regular rhythm and normal  heart sounds.   Pulmonary/Chest: Effort normal and breath sounds normal. She has no wheezes.  Musculoskeletal:  No CVA tenderness  Lymphadenopathy:    She has no cervical adenopathy.  Neurological: She is alert and oriented to person, place, and time.  Skin: Skin is warm and dry.  Psychiatric: She has a normal mood and affect.          Assessment & Plan:  UTI- UA + for leuk.  Since she is symptomatic I will go ahead and treat her for urinary tract infection with Cipro 500 mg twice a day. She can call me if she's not significantly better in 5 days. At that point we will do urine culture if she's not improving. She can also let me know if the back pain is improving. Not then consider that it could be completely separate issue such as a musculoskeletal strain.  Nipple discharge-will check her prolactin levels, thyroid. That sounds like this is most likely benign if the discharge is clear and has been going on for  quite some time. The nipple soreness itself could also be hormone related. We will check her estrogen, progesterone, FSH and LH. She denies any new exercise routine or change in prosthetic can be causing this irritation or friction. Her husband has had a vasectomy about 20 years ago so she says pregnancy is very unlikely. Will schedule screening mammogram.  URI-recommend symptomatically her. Call if not significantly better in one week. Next  Right ear noise-she describes it as a swishing-ear exam is normal today and give her reassurance. She notices any actual pain or discharge from the ear and then come back. This may be related to her current upper respiratory symptoms.  Heavy periods-we'll check a CBC to evaluate for anemia. She is at the age of 34 seconds her endometrial hyperplasia. Offered to schedule her for an ultrasound of the uterus but she declined and said she wants to start with the lab work and see how things go. She think she could be becoming perimenopausal as well.

## 2013-05-06 ENCOUNTER — Other Ambulatory Visit: Payer: Self-pay | Admitting: Family Medicine

## 2013-07-09 ENCOUNTER — Emergency Department (INDEPENDENT_AMBULATORY_CARE_PROVIDER_SITE_OTHER)
Admission: EM | Admit: 2013-07-09 | Discharge: 2013-07-09 | Disposition: A | Discharge status: Home / Self Care | Payer: 59 | Source: Home / Self Care | Attending: Family Medicine | Admitting: Family Medicine

## 2013-07-09 ENCOUNTER — Encounter: Payer: Self-pay | Admitting: Emergency Medicine

## 2013-07-09 DIAGNOSIS — J069 Acute upper respiratory infection, unspecified: Secondary | ICD-10-CM

## 2013-07-09 MED ORDER — BENZONATATE 200 MG PO CAPS: 200.0000 mg | ORAL_CAPSULE | Freq: Every day | ORAL | Status: DC

## 2013-07-09 MED ORDER — AZITHROMYCIN 250 MG PO TABS: ORAL_TABLET | ORAL | Status: DC

## 2013-07-09 NOTE — ED Provider Notes (Signed)
CSN: [id number]     Arrival date & time 07/09/13  1017 History   First MD Initiated Contact with Patient 07/09/13 1038     Chief Complaint  Patient presents with  . Otalgia    pressure  . Sore Throat    x 7 days  . Cough    production is dark green and worse in the am      HPI Comments: Patient complains of onset of URI symptoms one week ago with initial sinus congestion, sore throat, cough, chills, myalgias and fatigue.  Her cough has persisted, and now worse at night.  She has occasional wheezing and notes that she has an albuterol nebulizer at home.  She noticed increased discharge in her right eye this morning but no redness.  The history is provided by the patient.    Past Medical History  Diagnosis Date  . Anxiety    Past Surgical History  Procedure Laterality Date  . Back surgery    . Breast surgery     Family History  Problem Relation Age of Onset  . Hypertension Mother   . Cancer Father   . Diabetes Other    History  Substance Use Topics  . Smoking status: Former Smoker -- 10 years    Quit date: 09/17/2006  . Smokeless tobacco: Not on file  . Alcohol Use: No   OB History   Grav Para Term Preterm Abortions TAB SAB Ect Mult Living                 Review of Systems + sore throat + cough No pleuritic pain ? wheezing + nasal congestion + post-nasal drainage + sinus pain/pressure No itchy/red eyes ? earache No hemoptysis No SOB No fever, + chills No nausea No vomiting No abdominal pain No diarrhea No urinary symptoms No skin rash + fatigue + myalgias + headache Used OTC meds without relief  Allergies  Amoxicillin; Codeine; and Vicodin  Home Medications   Current Outpatient Rx  Name  Route  Sig  Dispense  Refill  . ALPRAZolam (XANAX) 0.5 MG tablet   Oral   Take 0.5 mg by mouth as needed.         Marland Kitchen PARoxetine (PAXIL) 10 MG tablet   Oral   Take 10 mg by mouth 2 (two) times daily.          Marland Kitchen azithromycin (ZITHROMAX Z-PAK) 250 MG  tablet      Take 2 tabs today; then begin one tab once daily for 4 more days. (Rx void after 07/17/13)   6 each   0   . benzonatate (TESSALON) 200 MG capsule   Oral   Take 1 capsule (200 mg total) by mouth at bedtime. Take as needed for cough   12 capsule   0    BP 133/81  Pulse 91  Temp(Src) 98.2 F (36.8 C) (Oral)  Ht 5\' 9"  (1.753 m)  Wt 219 lb (99.338 kg)  BMI 32.33 kg/m2  SpO2 99%  LMP 06/16/2013 Physical Exam Nursing notes and Vital Signs reviewed. Appearance:  Patient appears stated age, and in no acute distress.  Patient is obese (BMI 32.3) Eyes:  Pupils are equal, round, and reactive to light and accomodation.  Extraocular movement is intact.  Conjunctivae are not inflamed  Ears:  Canals normal.  Tympanic membranes normal.  Nose:  Mildly congested turbinates.  Mild right maxillary sinus tenderness is present.  Pharynx:  Normal Neck:  Supple.   Tender shotty posterior  nodes are palpated bilaterally, worse on right. Lungs:  Clear to auscultation.  Breath sounds are equal.  Heart:  Regular rate and rhythm without murmurs, rubs, or gallops.  Abdomen:  Nontender without masses or hepatosplenomegaly.  Bowel sounds are present.  No CVA or flank tenderness.  Extremities:  No edema.  No calf tenderness Skin:  No rash present.   ED Course  Procedures  none      MDM   1. Acute upper respiratory infections of unspecified site; suspect viral URI     There is no evidence of bacterial infection today.  Prescription written for Benzonatate [facility]) to take at bedtime for night-time cough.  Take plain Mucinex (guaifenesin) twice daily for cough and congestion.  Increase fluid intake, rest. May use Afrin nasal spray (or generic oxymetazoline) twice daily for about 5 days.  Also recommend using saline nasal spray several times daily and saline nasal irrigation (AYR is a common brand) Stop all antihistamines for now, and other non-prescription cough/cold preparations. May  continue albuterol as needed. Begin Azithromycin if not improving about 5 days or if persistent fever develops (Given a prescription to hold, with an expiration date)  Follow-up with family doctor if not improving 7 to 10 days.    Lattie Haw, MD 07/09/13 [phone]

## 2013-07-09 NOTE — ED Notes (Signed)
Patient complains of sore throat for a week, pressure in the ears, production when coughing and is dark green which is worse in the morning. Patient reports having fever, chills and night sweats. She has tried OTC Mucinex, Alka-seltzer cold.

## 2013-07-26 ENCOUNTER — Telehealth: Payer: Self-pay | Admitting: Family Medicine

## 2013-07-26 MED ORDER — PAROXETINE HCL 20 MG PO TABS: 20.0000 mg | ORAL_TABLET | Freq: Two times a day (BID) | ORAL | Status: DC

## 2013-07-26 NOTE — Telephone Encounter (Signed)
Patient has been seen by [facility] for anxiety but her provider has retired.  She will be out of meds tomorrow and I have made her an appt with you for Monday, Dec. 1 at 1:30.  She is on Paxil 20 mg. Twice daily.  Can we call her in enough to get to her appt?  [facility] on [address] is her pharmacy.  thanks

## 2013-07-26 NOTE — Telephone Encounter (Signed)
Pt informed.Kora Groom Lynetta  

## 2013-07-26 NOTE — Telephone Encounter (Signed)
[facility] will send over rx.  I am surprised they didn't giver her a refill or set her up with another provider

## 2013-08-01 ENCOUNTER — Ambulatory Visit (INDEPENDENT_AMBULATORY_CARE_PROVIDER_SITE_OTHER): Payer: 59 | Admitting: Family Medicine

## 2013-08-01 ENCOUNTER — Encounter: Payer: Self-pay | Admitting: Family Medicine

## 2013-08-01 VITALS — BP 130/70 | HR 84 | Temp 97.0°F | Ht 69.0 in | Wt 221.0 lb

## 2013-08-01 DIAGNOSIS — F418 Other specified anxiety disorders: Secondary | ICD-10-CM

## 2013-08-01 DIAGNOSIS — F341 Dysthymic disorder: Secondary | ICD-10-CM

## 2013-08-01 DIAGNOSIS — Z13220 Encounter for screening for lipoid disorders: Secondary | ICD-10-CM

## 2013-08-01 DIAGNOSIS — Z23 Encounter for immunization: Secondary | ICD-10-CM

## 2013-08-01 DIAGNOSIS — F4010 Social phobia, unspecified: Secondary | ICD-10-CM

## 2013-08-01 MED ORDER — ALPRAZOLAM 0.5 MG PO TABS: 0.5000 mg | ORAL_TABLET | ORAL | Status: DC | PRN

## 2013-08-01 MED ORDER — PAROXETINE HCL 40 MG PO TABS: 40.0000 mg | ORAL_TABLET | Freq: Every day | ORAL | Status: DC

## 2013-08-01 NOTE — Progress Notes (Signed)
   Subjective:    Patient ID: Jennifer Gray, female    DOB: June 06, 1966, 47 y.o.   MRN: [id number]  HPI Has been on current dose for about 12 years. Her psychiatrist recently retired and she would like to have me take over her medications for her depression/anxiety. She would like refill her Xanax as well. She says that her prescription was from 2013 for her xanax.  She occasionally will use it at bedtime if she can't sleep for couple days and red. She's not had any side effects of her current medication. She's happy with her regimen is not interested in increasing her dose.   Review of Systems     Objective:   Physical Exam  Constitutional: She is oriented to person, place, and time. She appears well-developed and well-nourished.  HENT:  Head: Normocephalic and atraumatic.  Right Ear: External ear normal.  Left Ear: External ear normal.  Nose: Nose normal.  Mouth/Throat: Oropharynx is clear and moist.  TMs and canals are clear.   Eyes: Conjunctivae and EOM are normal. Pupils are equal, round, and reactive to light.  Neck: Neck supple. No thyromegaly present.  Cardiovascular: Normal rate, regular rhythm and normal heart sounds.   Pulmonary/Chest: Effort normal and breath sounds normal. She has no wheezes.  Lymphadenopathy:    She has no cervical adenopathy.  Neurological: She is alert and oriented to person, place, and time.  Skin: Skin is warm and dry.  Psychiatric: She has a normal mood and affect.          Assessment & Plan:  Depression/Anxiety- GAD- 7 score of 7 today.  Well controlled. Even though her gad 7 score is not under 4 she's happy with her current regimen and does not want to increase the Paxil. She would like refill on the Xanax. She says typically 30 tabs last her a year or more. Reminded her to make sure he uses sparingly as her controlled substances and there is increased risk for dependency with but have the medication. She says she's well aware. She's not  interested in any therapy or counseling at this time but offered for her to call any point time if she would like a referral. Overall she is happy with her regimen. Followup in 6 months. 90 day supply with one refill sent to her pharmacy.  Last mammogram on file for [place] was approximately 2 years ago. She's pretty sure she had one done last year it came apart in [place]. We will call to get a copy of that report. She is due for screening lipids. Given lab slip today.

## 2013-08-05 ENCOUNTER — Other Ambulatory Visit: Payer: Self-pay | Admitting: *Deleted

## 2013-09-26 ENCOUNTER — Encounter: Payer: Self-pay | Admitting: Family Medicine

## 2013-09-26 ENCOUNTER — Ambulatory Visit (INDEPENDENT_AMBULATORY_CARE_PROVIDER_SITE_OTHER): Payer: 59 | Admitting: Family Medicine

## 2013-09-26 VITALS — BP 130/81 | HR 104 | Temp 98.1°F | Ht 69.0 in | Wt 222.0 lb

## 2013-09-26 DIAGNOSIS — R21 Rash and other nonspecific skin eruption: Secondary | ICD-10-CM

## 2013-09-26 NOTE — Progress Notes (Signed)
   Subjective:    Patient ID: Jennifer Gray, female    DOB: 11-28-1965, 47 y.o.   MRN: [id number]  HPI Rash on upper chest for 2-3 weeks. She says it feels tender to touch. Doesn't itch or otherwise bother her. She does have a lot of sun damage and her mother has a history of skin cancer. She just wanted to make sure that it was okay.   Review of Systems     Objective:   Physical Exam  Constitutional: She appears well-developed and well-nourished.  Skin: Skin is warm and dry.  Peak 1 cm papular lesion. No pearlescenc in appearance. No scale. It is well demarcated.oval shaped. Solar lentigo on backs of hands  Psychiatric: She has a normal mood and affect. Her behavior is normal.          Assessment & Plan:  Rash-unclear etiology. Skin scraping performed today just to rule out fungal elements. If negative then we'll try treating with a topical steroid. This could still also be a seborrheic keratosis. There is repeat in the rest of her moles and seborrheic keratoses are brown. Also consider could be skin cancer though it doesn't have the pearly look of a basal cell, and does not have the dry scaly look of a squamous cell. If not improving after 2 weeks of steroid then we'll consider biopsy.

## 2013-09-27 ENCOUNTER — Other Ambulatory Visit: Payer: Self-pay | Admitting: Family Medicine

## 2013-09-27 LAB — FERRITIN: Ferritin: 11 ng/mL (ref 10–291)

## 2013-09-27 LAB — CBC
HCT: 38.7 % (ref 36.0–46.0)
Hemoglobin: 12.9 g/dL (ref 12.0–15.0)
MCH: 24.8 pg — ABNORMAL LOW (ref 26.0–34.0)
MCHC: 33.3 g/dL (ref 30.0–36.0)
MCV: 74.4 fL — ABNORMAL LOW (ref 78.0–100.0)
Platelets: 457 10*3/uL — ABNORMAL HIGH (ref 150–400)
RBC: 5.2 MIL/uL — ABNORMAL HIGH (ref 3.87–5.11)
RDW: 15.6 % — ABNORMAL HIGH (ref 11.5–15.5)
WBC: 8.4 10*3/uL (ref 4.0–10.5)

## 2013-09-27 LAB — LIPID PANEL
Cholesterol: 204 mg/dL — ABNORMAL HIGH (ref 0–200)
HDL: 46 mg/dL (ref >39)
LDL Cholesterol: 124 mg/dL — ABNORMAL HIGH (ref 0–99)
Total CHOL/HDL Ratio: 4.4 Ratio
Triglycerides: 171 mg/dL — ABNORMAL HIGH (ref <150)
VLDL: 34 mg/dL (ref 0–40)

## 2013-09-27 LAB — ESTRADIOL: Estradiol: 471.1 pg/mL

## 2013-09-27 LAB — KOH PREP: RESULT - KOH: NONE SEEN

## 2013-09-27 LAB — LUTEINIZING HORMONE: LH: 4.9 m[IU]/mL

## 2013-09-27 LAB — TSH: TSH: 3.763 u[IU]/mL (ref 0.350–4.500)

## 2013-09-27 LAB — VITAMIN B12: Vitamin B-12: 649 pg/mL (ref 211–911)

## 2013-09-27 LAB — FOLLICLE STIMULATING HORMONE: FSH: 3.2 m[IU]/mL

## 2013-09-27 LAB — PROGESTERONE: Progesterone: 0.5 ng/mL

## 2013-09-27 LAB — PROLACTIN: Prolactin: 17.6 ng/mL

## 2013-09-27 MED ORDER — TRIAMCINOLONE ACETONIDE 0.5 % EX OINT: 1.0000 "application " | TOPICAL_OINTMENT | Freq: Two times a day (BID) | CUTANEOUS | Status: DC

## 2013-09-29 ENCOUNTER — Other Ambulatory Visit: Payer: Self-pay | Admitting: *Deleted

## 2013-09-29 DIAGNOSIS — R5383 Other fatigue: Principal | ICD-10-CM

## 2013-09-29 DIAGNOSIS — R5381 Other malaise: Secondary | ICD-10-CM

## 2014-02-03 ENCOUNTER — Telehealth: Payer: Self-pay | Admitting: Family Medicine

## 2014-02-03 NOTE — Telephone Encounter (Signed)
Pt called. Unable to schedule an appt because daughter is due to have baby, will be gone for 3 wks. She will be leaving on Tuesday no appts available on Monday, can she get enough meds to last until she returns. Thank you.

## 2014-04-03 ENCOUNTER — Other Ambulatory Visit: Payer: Self-pay | Admitting: Family Medicine

## 2014-04-13 ENCOUNTER — Ambulatory Visit (INDEPENDENT_AMBULATORY_CARE_PROVIDER_SITE_OTHER): Payer: 59 | Admitting: Family Medicine

## 2014-04-13 ENCOUNTER — Other Ambulatory Visit: Payer: Self-pay | Admitting: Family Medicine

## 2014-04-13 ENCOUNTER — Encounter: Payer: Self-pay | Admitting: Family Medicine

## 2014-04-13 ENCOUNTER — Ambulatory Visit (INDEPENDENT_AMBULATORY_CARE_PROVIDER_SITE_OTHER): Payer: 59

## 2014-04-13 VITALS — BP 138/72 | HR 89 | Wt 225.0 lb

## 2014-04-13 DIAGNOSIS — F4010 Social phobia, unspecified: Secondary | ICD-10-CM

## 2014-04-13 DIAGNOSIS — Z1231 Encounter for screening mammogram for malignant neoplasm of breast: Secondary | ICD-10-CM

## 2014-04-13 MED ORDER — ESCITALOPRAM OXALATE 10 MG PO TABS: 10.0000 mg | ORAL_TABLET | Freq: Every day | ORAL | Status: DC

## 2014-04-13 MED ORDER — PAROXETINE HCL 10 MG PO TABS: ORAL_TABLET | ORAL | Status: DC

## 2014-04-13 NOTE — Progress Notes (Signed)
   Subjective:    Patient ID: Jennifer Gray, female    DOB: April 14, 1966, 48 y.o.   MRN: [id number]  HPI Social anxiety d/o - She feels like she is sleeping too much. Still feels tired and has low energy. Little interest or pleasure in doing things more than half the days as well as feeling down. Some difficulty concentrating. No thoughts of being dead. Feeling nervous and on edge more than half the days as well as difficulty relaxing. Does feel like there some mild irritability. And being fearful that something might happen. She's currently taking Paxil 40 mg consistently. She's not had any side effects or problems with this, but not sure how effective it really is.   Review of Systems     Objective:   Physical Exam  Constitutional: She is oriented to person, place, and time. She appears well-developed and well-nourished.  HENT:  Head: Normocephalic and atraumatic.  Cardiovascular: Normal rate, regular rhythm and normal heart sounds.   Pulmonary/Chest: Effort normal and breath sounds normal.  Neurological: She is alert and oriented to person, place, and time.  Skin: Skin is warm and dry.  Psychiatric: She has a normal mood and affect. Her behavior is normal.          Assessment & Plan:  Social anxiety d/o - GAD 7 score of 13 and PHQ-9 score of 18 today. Uncontrolled.   We discussed different options. I really think we sent to switch medications and try to find something a little bit more effective. Her daughter actually takes Lexapro does really well on it. Will switch her to that. We'll need to taper her Paxil. Written instructions provided.  Will go for mammo today.

## 2014-04-13 NOTE — Patient Instructions (Addendum)
Once get down to 10mg  of Paxil one a day can start the Lexapro ( escitalopram)

## 2014-06-09 ENCOUNTER — Telehealth: Payer: Self-pay

## 2014-06-09 MED ORDER — PAROXETINE HCL 10 MG PO TABS: ORAL_TABLET | ORAL | Status: DC

## 2014-06-09 NOTE — Telephone Encounter (Signed)
Cut down on lexapro to half a 10mg  tablet for five days then begin new paroxetine regimen "1 a day for 5 days, then 2 a day for 5 days, then 3 a day thereafter.  Follow up with Dr. Jerilynn Mages late October."  New paroxetine Rx sent to [facility] on file.

## 2014-06-09 NOTE — Telephone Encounter (Signed)
Patient advised.

## 2014-06-09 NOTE — Telephone Encounter (Signed)
Jennifer Gray was seen on 04/13/2014 and switched from Paxil 40 mg to Lexapro 10 mg. She reports a worsening of her depression and increased irritability. She would like to switch back to her Paxil and needs a new prescription and recommendations on how to step down the Lexapro and restart Paxil. Dr Madilyn Fireman is out of office until Monday. Please advise.

## 2014-06-29 ENCOUNTER — Ambulatory Visit (INDEPENDENT_AMBULATORY_CARE_PROVIDER_SITE_OTHER): Payer: 59 | Admitting: Family Medicine

## 2014-06-29 ENCOUNTER — Encounter: Payer: Self-pay | Admitting: Family Medicine

## 2014-06-29 VITALS — BP 126/76 | HR 98 | Temp 98.0°F | Ht 69.0 in | Wt 225.0 lb

## 2014-06-29 DIAGNOSIS — F4010 Social phobia, unspecified: Secondary | ICD-10-CM

## 2014-06-29 DIAGNOSIS — F329 Major depressive disorder, single episode, unspecified: Secondary | ICD-10-CM

## 2014-06-29 DIAGNOSIS — E669 Obesity, unspecified: Secondary | ICD-10-CM

## 2014-06-29 DIAGNOSIS — F32A Depression, unspecified: Secondary | ICD-10-CM

## 2014-06-29 MED ORDER — PAROXETINE HCL 40 MG PO TABS: 40.0000 mg | ORAL_TABLET | Freq: Every day | ORAL | Status: DC

## 2014-06-29 NOTE — Patient Instructions (Signed)
Phentermine tablets or capsules What is this medicine? PHENTERMINE (FEN ter meen) decreases your appetite. It is used with a reduced calorie diet and exercise to help you lose weight. This medicine may be used for other purposes; ask your health care provider or pharmacist if you have questions. COMMON BRAND NAME(S): Adipex-P, Atti-Plex P, Atti-Plex P Spansule, Fastin, Pro-Fast, Tara-8 What should I tell my health care provider before I take this medicine? They need to know if you have any of these conditions: -agitation -glaucoma -heart disease -high blood pressure -history of substance abuse -lung disease called Primary Pulmonary Hypertension (PPH) -taken an MAOI like Carbex, Eldepryl, Marplan, Nardil, or Parnate in last 14 days -thyroid disease -an unusual or allergic reaction to phentermine, other medicines, foods, dyes, or preservatives -pregnant or trying to get pregnant -breast-feeding How should I use this medicine? Take this medicine by mouth with a glass of water. Follow the directions on the prescription label. This medicine is usually taken 30 minutes before or 1 to 2 hours after breakfast. Avoid taking this medicine in the evening. It may interfere with sleep. Take your doses at regular intervals. Do not take your medicine more often than directed. Talk to your pediatrician regarding the use of this medicine in children. Special care may be needed. Overdosage: If you think you have taken too much of this medicine contact a poison control center or emergency room at once. NOTE: This medicine is only for you. Do not share this medicine with others. What if I miss a dose? If you miss a dose, take it as soon as you can. If it is almost time for your next dose, take only that dose. Do not take double or extra doses. What may interact with this medicine? Do not take this medicine with any of the following medications: -duloxetine -MAOIs like Carbex, Eldepryl, Marplan, Nardil, and  Parnate -medicines for colds or breathing difficulties like pseudoephedrine or phenylephrine -procarbazine -sibutramine -SSRIs like citalopram, escitalopram, fluoxetine, fluvoxamine, paroxetine, and sertraline -stimulants like dexmethylphenidate, methylphenidate or modafinil -venlafaxine This medicine may also interact with the following medications: -medicines for diabetes This list may not describe all possible interactions. Give your health care provider a list of all the medicines, herbs, non-prescription drugs, or dietary supplements you use. Also tell them if you smoke, drink alcohol, or use illegal drugs. Some items may interact with your medicine. What should I watch for while using this medicine? Notify your physician immediately if you become short of breath while doing your normal activities. Do not take this medicine within 6 hours of bedtime. It can keep you from getting to sleep. Avoid drinks that contain caffeine and try to stick to a regular bedtime every night. This medicine was intended to be used in addition to a healthy diet and exercise. The best results are achieved this way. This medicine is only indicated for short-term use. Eventually your weight loss may level out. At that point, the drug will only help you maintain your new weight. Do not increase or in any way change your dose without consulting your doctor. You may get drowsy or dizzy. Do not drive, use machinery, or do anything that needs mental alertness until you know how this medicine affects you. Do not stand or sit up quickly, especially if you are an older patient. This reduces the risk of dizzy or fainting spells. Alcohol may increase dizziness and drowsiness. Avoid alcoholic drinks. What side effects may I notice from receiving this medicine? Side effects that  you should report to your doctor or health care professional as soon as possible: -chest pain, palpitations -depression or severe changes in  mood -increased blood pressure -irritability -nervousness or restlessness -severe dizziness -shortness of breath -problems urinating -unusual swelling of the legs -vomiting Side effects that usually do not require medical attention (report to your doctor or health care professional if they continue or are bothersome): -blurred vision or other eye problems -changes in sexual ability or desire -constipation or diarrhea -difficulty sleeping -dry mouth or unpleasant taste -headache -nausea This list may not describe all possible side effects. Call your doctor for medical advice about side effects. You may report side effects to FDA at 1-800-FDA-1088. Where should I keep my medicine? Keep out of the reach of children. This medicine can be abused. Keep your medicine in a safe place to protect it from theft. Do not share this medicine with anyone. Selling or giving away this medicine is dangerous and against the law. Store at room temperature between 20 and 25 degrees C (68 and 77 degrees F). Keep container tightly closed. Throw away any unused medicine after the expiration date. NOTE: This sheet is a summary. It may not cover all possible information. If you have questions about this medicine, talk to your doctor, pharmacist, or health care provider.  2015, Elsevier/Gold Standard. (2010-10-02 11:02:44)  

## 2014-06-29 NOTE — Progress Notes (Signed)
   Subjective:    Patient ID: Jennifer Gray, female    DOB: 1966-07-16, 48 y.o.   MRN: [id number]  HPI F/U social anxiety d/o - when I last saw her she was taking Paxil and it was not effectively controlling her symptoms we decided to try Lexapro which her daughter takes. After about a month of taking the medication she called back and felt like it was making her more irritable and affecting her depression. She wanted to go back on the Paxil so we sent a new prescription and she restarted that over a month ago.   Obesity-she would also like to discuss her weight today. She feels like a big factor in her depression is that she is unhappy with her physical appearance. She really would like to try to lose weight. She has started exercising recently but is not counting her calories. She feels like she doesn't eat very much. She does skip meals sometimes.  Review of Systems     Objective:   Physical Exam  Constitutional: She is oriented to person, place, and time. She appears well-developed and well-nourished.  HENT:  Head: Normocephalic and atraumatic.  Cardiovascular: Normal rate, regular rhythm and normal heart sounds.   Pulmonary/Chest: Effort normal and breath sounds normal.  Neurological: She is alert and oriented to person, place, and time.  Skin: Skin is warm and dry.  Psychiatric: She has a normal mood and affect. Her behavior is normal.          Assessment & Plan:  Social anxiety disorder-GAD-7 Score of 2 Today and PHQ 9 Score of 12 Today. It Is Improved from Previous. Her Previous CAD 7 Score Was 13 and Her Previous PHQ 9 Score Was 18. Her scores are better there she is back on her Paxil. We discussed different options including increasing her Paxil that she is very worried about the potential for weight gain by doing this. We could still consider changing the medication in the future. She says she just wants to think about it for right now she wants to continue her current dose  of Paxil. Next  Obesity/BMI 33-we discussed weight loss options. We discussed the importance of regular exercise for at least 5 days per week especially if she's trying to lose weight. Right now she is exercising on the elliptical about 3 days per week. So she needs to increase this. We also discussed keeping track of calories with a smart phone application such as my thickness spell. She could certainly look into other program such as Weight Watchers etc. We did discuss the potential for weight loss medications. She would be a good candidate for phentermine as her blood pressure is well controlled and she has no prior history of chest pain or cardiac problems. I did give her some additional information and encouraged her to think about it. She will call back and let me know. I did explain to her that this would require monthly visits for monitoring. We also discussed the importance of eating regular small meals to help boost her metabolism.  Time spent 25 minutes, greater than 50% time spent counseling about anxiety and obesity and weight loss strategies.

## 2014-08-07 ENCOUNTER — Ambulatory Visit: Payer: 59 | Admitting: Physician Assistant

## 2014-08-07 DIAGNOSIS — Z0289 Encounter for other administrative examinations: Secondary | ICD-10-CM

## 2014-08-11 ENCOUNTER — Encounter: Payer: Self-pay | Admitting: Emergency Medicine

## 2014-08-11 ENCOUNTER — Emergency Department (INDEPENDENT_AMBULATORY_CARE_PROVIDER_SITE_OTHER): Payer: 59

## 2014-08-11 ENCOUNTER — Emergency Department (INDEPENDENT_AMBULATORY_CARE_PROVIDER_SITE_OTHER)
Admission: EM | Admit: 2014-08-11 | Discharge: 2014-08-11 | Disposition: A | Discharge status: Home / Self Care | Payer: 59 | Source: Home / Self Care | Attending: Family Medicine | Admitting: Family Medicine

## 2014-08-11 ENCOUNTER — Other Ambulatory Visit: Payer: Self-pay | Admitting: Family Medicine

## 2014-08-11 DIAGNOSIS — R05 Cough: Secondary | ICD-10-CM

## 2014-08-11 DIAGNOSIS — R059 Cough, unspecified: Secondary | ICD-10-CM

## 2014-08-11 DIAGNOSIS — R053 Chronic cough: Secondary | ICD-10-CM

## 2014-08-11 LAB — POCT CBC W AUTO DIFF (K'VILLE URGENT CARE)

## 2014-08-11 MED ORDER — CLARITHROMYCIN 500 MG PO TABS: 500.0000 mg | ORAL_TABLET | Freq: Two times a day (BID) | ORAL | Status: DC

## 2014-08-11 MED ORDER — BENZONATATE 200 MG PO CAPS: 200.0000 mg | ORAL_CAPSULE | Freq: Every day | ORAL | Status: DC

## 2014-08-11 MED ORDER — ALBUTEROL SULFATE HFA 108 (90 BASE) MCG/ACT IN AERS: 2.0000 | INHALATION_SPRAY | RESPIRATORY_TRACT | Status: DC | PRN

## 2014-08-11 MED ORDER — PREDNISONE 20 MG PO TABS: 20.0000 mg | ORAL_TABLET | Freq: Two times a day (BID) | ORAL | Status: DC

## 2014-08-11 NOTE — Discharge Instructions (Signed)
Take plain Mucinex (1200 mg guaifenesin) twice daily for cough and congestion.  Increase fluid intake, rest.  Stop all antihistamines for now, and other non-prescription cough/cold preparations. Follow-up with family doctor if not improving about one week

## 2014-08-11 NOTE — ED Provider Notes (Signed)
CSN: [id number]     Arrival date & time 08/11/14  1342 History   First MD Initiated Contact with Patient 08/11/14 1355     Chief Complaint  Patient presents with  . Cough  . Nasal Congestion  . Fatigue  . Headache  . Shortness of Breath      HPI Comments: Patient complains of onset of a non-productive cough about 3 weeks ago after blowing leaves.  She has had minimal sinus congestion.  She often coughs until she gags, and has had shortness of breath and wheezing. She has a history of seasonal asthma. Her husband has had a cough for about 2.5 months.  Her last Tdap was in 2010.  The history is provided by the patient and the spouse.    Past Medical History  Diagnosis Date  . Anxiety    Past Surgical History  Procedure Laterality Date  . Back surgery    . Breast surgery     Family History  Problem Relation Age of Onset  . Hypertension Mother   . Cancer Father   . Diabetes Other    History  Substance Use Topics  . Smoking status: Former Smoker -- 10 years    Quit date: 09/17/2006  . Smokeless tobacco: Not on file  . Alcohol Use: No   OB History    No data available     Review of Systems + sore throat + hoarse + cough No pleuritic pain + wheezing Minimal nasal congestion No post-nasal drainage No sinus pain/pressure No itchy/red eyes NO earache No hemoptysis + SOB No fever, + chills/sweats No nausea No vomiting No abdominal pain No diarrhea No urinary symptoms No skin rash + fatigue No myalgias + headache Used OTC meds without relief  Allergies  Amoxicillin; Codeine; and Vicodin  Home Medications   Prior to Admission medications   Medication Sig Start Date End Date Taking? Authorizing Provider  albuterol (PROVENTIL HFA;VENTOLIN HFA) 108 (90 BASE) MCG/ACT inhaler Inhale 2 puffs into the lungs every 4 (four) hours as needed for wheezing or shortness of breath. 08/11/14   Kandra Nicolas, MD  albuterol (PROVENTIL) (2.5 MG/3ML) 0.083% nebulizer  solution Take 3 mLs (2.5 mg total) by nebulization every 6 (six) hours as needed for wheezing. 11/17/11 11/16/12  Donella Stade, PA-C  ALPRAZolam (XANAX) 0.5 MG tablet Take 1 tablet (0.5 mg total) by mouth as needed for anxiety or sleep. 08/01/13   Hali Marry, MD  benzonatate (TESSALON) 200 MG capsule Take 1 capsule (200 mg total) by mouth at bedtime. Take as needed for cough 08/11/14   Kandra Nicolas, MD  clarithromycin (BIAXIN) 500 MG tablet Take 1 tablet (500 mg total) by mouth 2 (two) times daily. Take with food. 08/11/14   Kandra Nicolas, MD  PARoxetine (PAXIL) 40 MG tablet Take 1 tablet (40 mg total) by mouth daily. 06/29/14   Hali Marry, MD  predniSONE (DELTASONE) 20 MG tablet Take 1 tablet (20 mg total) by mouth 2 (two) times daily. Take with food. 08/11/14   Kandra Nicolas, MD   BP 130/83 mmHg  Pulse 110  Temp(Src) 98.5 F (36.9 C) (Oral)  Resp 18  Ht 5\' 8"  (1.727 m)  Wt 219 lb (99.338 kg)  BMI 33.31 kg/m2  SpO2 96%  LMP 08/04/2014 Physical Exam Nursing notes and Vital Signs reviewed. Appearance:  Patient appears stated age, and in no acute distress.  Patient is obese (BMI 33.3) Eyes:  Pupils are equal, round, and  reactive to light and accomodation.  Extraocular movement is intact.  Conjunctivae are not inflamed  Ears:  Canals normal.  Tympanic membranes normal.  Nose:  Mildly congested turbinates.  No sinus tenderness.   Pharynx:  Normal Neck:  Supple.  No adenopathy Lungs:  Clear to auscultation.  Breath sounds are equal.  Heart:  Regular rate and rhythm without murmurs, rubs, or gallops.  Abdomen:  Nontender without masses or hepatosplenomegaly.  Bowel sounds are present.  No CVA or flank tenderness.  Extremities:  No edema.  No calf tenderness Skin:  No rash present.   ED Course  Procedures  none    Labs Reviewed  BORDETELLA PERTUSSIS PCR  MISCELLANEOUS TEST POCT CBC:  WBC 8.6; LY 15.7; MO 10.5; GR 73.8; Hgb 13.4; Platelets 500     Imaging  Review Dg Chest 2 View  08/11/2014   CLINICAL DATA:  Cough.  EXAM: CHEST  2 VIEW  COMPARISON:  April 25, 2009.  FINDINGS: The heart size and mediastinal contours are within normal limits. Both lungs are clear. No pneumothorax or pleural effusion is noted. The visualized skeletal structures are unremarkable.  IMPRESSION: No acute cardiopulmonary abnormality seen.   Electronically Signed   By: Sabino Dick M.D.   On: 08/11/2014 14:33     MDM   1. Cough, persistent; ?pertussis        PCR and culture for pertussis pending Begin Biaxin and prednisone burst.  Rx for albuterol inhaler.  Prescription written for Benzonatate [facility]) to take at bedtime for night-time cough.  Take plain Mucinex (1200 mg guaifenesin) twice daily for cough and congestion.  Increase fluid intake, rest.  Stop all antihistamines for now, and other non-prescription cough/cold preparations. Follow-up with family doctor if not improving about one week    Kandra Nicolas, MD 08/13/14 [id number]

## 2014-08-11 NOTE — ED Notes (Signed)
Reports 3 week history of congestion, cough, fatigue, chest muscles sore, shortness of breath with cough. Husband has been ill for 10 weeks with respiratory infection; has been exposed to pneumonia.

## 2014-08-16 LAB — BORDETELLA PERTUSSIS PCR
B PARAPERTUSSIS, DNA: NOT DETECTED
B pertussis, DNA: NOT DETECTED

## 2014-08-19 LAB — CULTURE, BORDETELLA PERTUSSIS

## 2014-08-19 LAB — BORDETELLA PERTUSSIS PCR
B parapertussis, DNA: NOT DETECTED
B pertussis, DNA: NOT DETECTED

## 2014-09-25 ENCOUNTER — Encounter: Payer: Self-pay | Admitting: Family Medicine

## 2014-09-25 ENCOUNTER — Ambulatory Visit (INDEPENDENT_AMBULATORY_CARE_PROVIDER_SITE_OTHER): Payer: 59 | Admitting: Family Medicine

## 2014-09-25 ENCOUNTER — Telehealth: Payer: Self-pay | Admitting: Emergency Medicine

## 2014-09-25 VITALS — BP 153/90 | HR 105 | Temp 97.6°F | Wt 224.0 lb

## 2014-09-25 DIAGNOSIS — B9689 Other specified bacterial agents as the cause of diseases classified elsewhere: Secondary | ICD-10-CM

## 2014-09-25 DIAGNOSIS — J329 Chronic sinusitis, unspecified: Secondary | ICD-10-CM

## 2014-09-25 DIAGNOSIS — A499 Bacterial infection, unspecified: Secondary | ICD-10-CM

## 2014-09-25 MED ORDER — LEVOFLOXACIN 500 MG PO TABS: 500.0000 mg | ORAL_TABLET | Freq: Every day | ORAL | Status: DC

## 2014-09-25 MED ORDER — HYDROCODONE-HOMATROPINE 5-1.5 MG/5ML PO SYRP: 5.0000 mL | ORAL_SOLUTION | Freq: Three times a day (TID) | ORAL | Status: DC | PRN

## 2014-09-25 NOTE — Telephone Encounter (Signed)
Call from pharmacist questioning 2 scripts for patient today; cough medicine and antibiotic have components she is allergic to. Do you want to order alternatives? He understands the cough med is probably OK, per patient.

## 2014-09-25 NOTE — Progress Notes (Signed)
CC: Jennifer Gray is a 49 y.o. female is here for Sinusitis   Subjective: HPI:   sore throat, nasal congestion, postnasal drip all of which has been present for about  2 months now. Symptoms drastically worsened over the past 3 or 4 days.  She was slightly improved after taking Biaxin and prednisone back in December but symptoms   never felt like they went away fully. Symptoms are now bad enough to keep her awake at night.  No benefit from nasal saline washes  Nor over-the-counter cough and cold medication. Symptoms are present all hour of the day but seemed to be most problematic at night. Reports subjective fever and chills.  Review of systems positive for facial pressure in the forehead and cheeks.  Denies confusion, rash, shortness of breath, wheezing, nor chest discomfort. Review of systems positive for fatigue.  Review Of Systems Outlined In HPI  Past Medical History  Diagnosis Date  . Anxiety     Past Surgical History  Procedure Laterality Date  . Back surgery    . Breast surgery     Family History  Problem Relation Age of Onset  . Hypertension Mother   . Cancer Father   . Diabetes Other     History   Social History  . Marital Status: Married    Spouse Name: N/A    Number of Children: N/A  . Years of Education: N/A   Occupational History  . Not on file.   Social History Main Topics  . Smoking status: Former Smoker -- 10 years    Quit date: 09/17/2006  . Smokeless tobacco: Not on file  . Alcohol Use: No  . Drug Use: No  . Sexual Activity: Not on file   Other Topics Concern  . Not on file   Social History Narrative     Objective: BP 153/90 mmHg  Pulse 105  Temp(Src) 97.6 F (36.4 C) (Oral)  Wt 224 lb (101.606 kg)  General: Alert and Oriented, No Acute Distress HEENT: Pupils equal, round, reactive to light. Conjunctivae clear.  External ears unremarkable, canals clear with intact TMs with appropriate landmarks.  Middle ear appears open without  effusion.  Boggy erythematous inferior turbinaes with moderate mucoid discharge.  Moist mucous membranes, pharynx without  Lesions however severe postnasal drip. Uvula is midline.  Neck supple without palpable lymphadenopathy nor abnormal masses. Lungs: Clear to auscultation bilaterally, no wheezing/ronchi/rales.  Comfortable work of breathing. Good air movement. Extremities: No peripheral edema.  Strong peripheral pulses.  Mental Status: No depression, anxiety, nor agitation. Skin: Warm and dry.  Assessment & Plan: Jennifer Gray was seen today for sinusitis.  Diagnoses and associated orders for this visit:  Bacterial sinusitis - levofloxacin (LEVAQUIN) 500 MG tablet; Take 1 tablet (500 mg total) by mouth daily. - HYDROcodone-homatropine (HYCODAN) 5-1.5 MG/5ML syrup; Take 5 mLs by mouth every 8 (eight) hours as needed for cough.     bacterial sinusitis: Start levofloxacin, begin as needed Hycodan to help with sleep,  She tells me her hydrocodone and  Codeine allergy is an itch. Continue nasal saline washes.   Return if symptoms worsen or fail to improve.

## 2014-09-25 NOTE — Telephone Encounter (Signed)
Pt.notified

## 2014-09-25 NOTE — Telephone Encounter (Signed)
Jennifer Gray, Will you please relay that it should be safe to fill both Rxs.  Her intolerance to hydrocodone is an itch and we have no documented allergic reaction to levofloxacin and she has tolerated ciprofloxacin in the past.

## 2014-09-26 ENCOUNTER — Telehealth: Payer: Self-pay | Admitting: *Deleted

## 2014-09-26 NOTE — Telephone Encounter (Signed)
Husband called and states his daughter called and panicking saying the pt has a severe HA and her stomach is upset. He states she took both the cough med an abx this am and now has a severe HA ever since. He states he is not with the patient now nor has he spoken with her but wants advise as to what to do. I advised to take pt to UC so that she may be evaluated. Spouse voiced understanding and requested this phone note be sent to Dr. Ileene Rubens

## 2014-09-26 NOTE — Telephone Encounter (Signed)
The only additional advice I would add is that I would suspect the cough medication is the culprit which should be stopped.  The antibiotic, levofloxacin, is similar to ciprofloxacin which she's taken in September 2014 for a uti without any documented side effects so it'd be very odd for the antibiotic to be causing her symptoms, I would continue the antibiotic unless told otherwise by another physician.

## 2014-09-27 NOTE — Telephone Encounter (Signed)
Pt notified and she thought it may have been the cough med and she stopped it. She states she is feeling better

## 2014-10-24 ENCOUNTER — Other Ambulatory Visit: Payer: Self-pay

## 2014-10-24 MED ORDER — PAROXETINE HCL 40 MG PO TABS: 40.0000 mg | ORAL_TABLET | Freq: Every day | ORAL | Status: DC

## 2015-02-07 ENCOUNTER — Other Ambulatory Visit: Payer: Self-pay | Admitting: *Deleted

## 2015-02-07 MED ORDER — ALPRAZOLAM 0.5 MG PO TABS: 0.5000 mg | ORAL_TABLET | ORAL | Status: DC | PRN

## 2015-02-13 ENCOUNTER — Ambulatory Visit (INDEPENDENT_AMBULATORY_CARE_PROVIDER_SITE_OTHER): Payer: BLUE CROSS/BLUE SHIELD | Admitting: Family Medicine

## 2015-02-13 ENCOUNTER — Encounter: Payer: Self-pay | Admitting: Family Medicine

## 2015-02-13 VITALS — BP 128/78 | HR 112 | Ht 68.0 in | Wt 224.0 lb

## 2015-02-13 DIAGNOSIS — K5904 Chronic idiopathic constipation: Secondary | ICD-10-CM | POA: Insufficient documentation

## 2015-02-13 DIAGNOSIS — R0789 Other chest pain: Secondary | ICD-10-CM

## 2015-02-13 DIAGNOSIS — F4010 Social phobia, unspecified: Secondary | ICD-10-CM

## 2015-02-13 DIAGNOSIS — K5909 Other constipation: Secondary | ICD-10-CM

## 2015-02-13 DIAGNOSIS — K5900 Constipation, unspecified: Secondary | ICD-10-CM

## 2015-02-13 DIAGNOSIS — Z1211 Encounter for screening for malignant neoplasm of colon: Secondary | ICD-10-CM | POA: Diagnosis not present

## 2015-02-13 MED ORDER — PAROXETINE HCL 40 MG PO TABS: 40.0000 mg | ORAL_TABLET | Freq: Every day | ORAL | Status: DC

## 2015-02-13 NOTE — Progress Notes (Signed)
   Subjective:    Patient ID: Jennifer Gray, female    DOB: 30-Oct-1965, 49 y.o.   MRN: [id number]  HPI Follow up social anxiety-this is her six-month follow-up. She is currently taking Paxil 40 mg daily and Xanax half a milligram as needed.  Only using it once a month Sleep is ok.  + fatigue.  No regular exercise but stays active. She has difficulty relaxing and feels restless. She also becomes easily annoyed. She reports little interest or pleasure doing things several days of the week and feeling tired more than half the time. She also feels down and depressed several days of the week. She denies any thoughts of when to harm herself. She rates her symptoms is somewhat difficult.  Having occ chest pain esp when stressed.  Recently put her mother in assisted living.  No other triggers. No alleviating factors. Can last all day.  No GERD or reflux issues at that time. Occ wake up a night  Review of Systems     Objective:   Physical Exam  Constitutional: She is oriented to person, place, and time. She appears well-developed and well-nourished.  HENT:  Head: Normocephalic and atraumatic.  Cardiovascular: Normal rate, regular rhythm and normal heart sounds.   Pulmonary/Chest: Effort normal and breath sounds normal.  Neurological: She is alert and oriented to person, place, and time.  Skin: Skin is warm and dry.  Psychiatric: She has a normal mood and affect. Her behavior is normal.          Assessment & Plan:  Social anxiety disorder-gad 7 score of 6. PHQ 9 score of 9 . She is happy with her regimen and using her benzo sparingly.   Atypical chest pain - likely stress related but did do EKG today just for reassurance.  She does have a hx of low iron and she is not on anything. Last ferritin a year ago was 11. Encouraged her to start some extra iron or possibly a prenatal vitamin which tends to have extra iron. This could help give her some more energy.  Refer for screening colonoscopy.  Due for screening now she is age 49. [facility], she thinks between 50-10 years ago. She would like to go back there.

## 2015-02-13 NOTE — Patient Instructions (Signed)
Start an otc iron or prenatal iron.

## 2015-02-14 ENCOUNTER — Encounter: Payer: Self-pay | Admitting: Family Medicine

## 2015-10-26 ENCOUNTER — Other Ambulatory Visit: Payer: Self-pay

## 2015-10-26 MED ORDER — PAROXETINE HCL 40 MG PO TABS: 40.0000 mg | ORAL_TABLET | Freq: Every day | ORAL | Status: DC

## 2015-11-02 ENCOUNTER — Encounter: Payer: Self-pay | Admitting: Family Medicine

## 2015-11-02 ENCOUNTER — Ambulatory Visit (INDEPENDENT_AMBULATORY_CARE_PROVIDER_SITE_OTHER): Payer: BLUE CROSS/BLUE SHIELD | Admitting: Family Medicine

## 2015-11-02 VITALS — BP 118/62 | HR 103 | Ht 68.0 in | Wt 226.0 lb

## 2015-11-02 DIAGNOSIS — Z1231 Encounter for screening mammogram for malignant neoplasm of breast: Secondary | ICD-10-CM

## 2015-11-02 DIAGNOSIS — Z13220 Encounter for screening for lipoid disorders: Secondary | ICD-10-CM

## 2015-11-02 DIAGNOSIS — Z23 Encounter for immunization: Secondary | ICD-10-CM

## 2015-11-02 DIAGNOSIS — F4010 Social phobia, unspecified: Secondary | ICD-10-CM

## 2015-11-02 NOTE — Progress Notes (Signed)
   Subjective:    Patient ID: Jennifer Gray, female    DOB: 04/26/1966, 50 y.o.   MRN: [id number]  HPI Here for f/U anxiety - On paxil daily and uses her xanax PRN. Back she still has most of her vital left ovary that was written 6 months ago. She is not exercising currently. She does feel like her medication is working and is effective..   Review of Systems     Objective:   Physical Exam  Constitutional: She is oriented to person, place, and time. She appears well-developed and well-nourished.  HENT:  Head: Normocephalic and atraumatic.  Cardiovascular: Normal rate, regular rhythm and normal heart sounds.   Pulmonary/Chest: Effort normal and breath sounds normal.  Neurological: She is alert and oriented to person, place, and time.  Skin: Skin is warm and dry.  Psychiatric: She has a normal mood and affect. Her behavior is normal.          Assessment & Plan:  Anxiety - GAD- 7 score of 4,previous of 6. PHQ- 9 score of 4, previous of 9.  Continue current regimen. Refills sent to the pharmacy. Follow-up in 6 months.  Discussed need for colon cancer screening.  Also discussed: Guarded as an option. She will call us back of her insurance is going to cover it.  Due for screening lipid panel.  Discussed need for screening mammogram. Order placed today.  She is well overdue for Pap smear as well and encouraged her to schedule a physical with Pap smear in the next couple of months.  Time spent 15 minutes, greater than 50% time spent counseling about preventative care including need for colon cancer screening, mammogram, lab work, Pap smear and her anxiety.  Flu vaccine given today.

## 2015-11-16 ENCOUNTER — Ambulatory Visit (INDEPENDENT_AMBULATORY_CARE_PROVIDER_SITE_OTHER): Payer: BLUE CROSS/BLUE SHIELD

## 2015-11-16 DIAGNOSIS — R928 Other abnormal and inconclusive findings on diagnostic imaging of breast: Secondary | ICD-10-CM | POA: Diagnosis not present

## 2015-11-16 DIAGNOSIS — Z1231 Encounter for screening mammogram for malignant neoplasm of breast: Secondary | ICD-10-CM

## 2015-11-28 ENCOUNTER — Other Ambulatory Visit: Payer: Self-pay | Admitting: Family Medicine

## 2015-11-28 DIAGNOSIS — R928 Other abnormal and inconclusive findings on diagnostic imaging of breast: Secondary | ICD-10-CM

## 2015-12-07 ENCOUNTER — Ambulatory Visit
Admission: RE | Admit: 2015-12-07 | Discharge: 2015-12-07 | Disposition: A | Discharge status: Home / Self Care | Payer: BLUE CROSS/BLUE SHIELD | Source: Ambulatory Visit | Attending: Family Medicine | Admitting: Family Medicine

## 2015-12-07 DIAGNOSIS — R928 Other abnormal and inconclusive findings on diagnostic imaging of breast: Secondary | ICD-10-CM

## 2016-05-19 ENCOUNTER — Ambulatory Visit (INDEPENDENT_AMBULATORY_CARE_PROVIDER_SITE_OTHER): Payer: BLUE CROSS/BLUE SHIELD

## 2016-05-19 ENCOUNTER — Other Ambulatory Visit: Payer: Self-pay | Admitting: Family Medicine

## 2016-05-19 ENCOUNTER — Ambulatory Visit (INDEPENDENT_AMBULATORY_CARE_PROVIDER_SITE_OTHER): Payer: BLUE CROSS/BLUE SHIELD | Admitting: Family Medicine

## 2016-05-19 ENCOUNTER — Encounter: Payer: Self-pay | Admitting: Family Medicine

## 2016-05-19 VITALS — BP 138/90 | HR 107 | Temp 98.8°F | Wt 225.0 lb

## 2016-05-19 DIAGNOSIS — R0789 Other chest pain: Secondary | ICD-10-CM

## 2016-05-19 DIAGNOSIS — J069 Acute upper respiratory infection, unspecified: Secondary | ICD-10-CM | POA: Diagnosis not present

## 2016-05-19 DIAGNOSIS — R0602 Shortness of breath: Secondary | ICD-10-CM

## 2016-05-19 DIAGNOSIS — I459 Conduction disorder, unspecified: Secondary | ICD-10-CM | POA: Diagnosis not present

## 2016-05-19 DIAGNOSIS — R5383 Other fatigue: Secondary | ICD-10-CM

## 2016-05-19 DIAGNOSIS — Z23 Encounter for immunization: Secondary | ICD-10-CM | POA: Diagnosis not present

## 2016-05-19 DIAGNOSIS — L710 Perioral dermatitis: Secondary | ICD-10-CM | POA: Diagnosis not present

## 2016-05-19 LAB — CBC WITH DIFFERENTIAL/PLATELET
BASOS ABS: 91 {cells}/uL (ref 0–200)
Basophils Relative: 1 %
EOS ABS: 182 {cells}/uL (ref 15–500)
Eosinophils Relative: 2 %
HCT: 41 % (ref 35.0–45.0)
Hemoglobin: 13.4 g/dL (ref 11.7–15.5)
LYMPHS PCT: 20 %
Lymphs Abs: 1820 cells/uL (ref 850–3900)
MCH: 25.1 pg — AB (ref 27.0–33.0)
MCHC: 32.7 g/dL (ref 32.0–36.0)
MCV: 76.8 fL — AB (ref 80.0–100.0)
MONOS PCT: 8 %
MPV: 9.4 fL (ref 7.5–12.5)
Monocytes Absolute: 728 cells/uL (ref 200–950)
NEUTROS PCT: 69 %
Neutro Abs: 6279 cells/uL (ref 1500–7800)
PLATELETS: 478 10*3/uL — AB (ref 140–400)
RBC: 5.34 MIL/uL — ABNORMAL HIGH (ref 3.80–5.10)
RDW: 14.4 % (ref 11.0–15.0)
WBC: 9.1 10*3/uL (ref 3.8–10.8)

## 2016-05-19 LAB — D-DIMER, QUANTITATIVE (NOT AT ARMC): D DIMER QUANT: 0.3 ug{FEU}/mL (ref <0.50)

## 2016-05-19 LAB — TSH: TSH: 3.82 mIU/L

## 2016-05-19 MED ORDER — PIMECROLIMUS 1 % EX CREA: TOPICAL_CREAM | Freq: Two times a day (BID) | CUTANEOUS | 0 refills | Status: DC

## 2016-05-19 NOTE — Progress Notes (Signed)
Subjective:    CC:   HPI:  Sinus congestion x 2 days. Facial pressure. No cough, SOB or fever.   RAsh around her mouths x 1 month. Using alcohol and peroxide to clear it up.  It really has not been helping. She actually quit wearing makeup a couple weeks ago just to make sure that that was not causing it. It did get better but did not completely clear up. Says the month initially had a clear fluid in them.  Has had intermittent CP for 2 months.  She says it feels like a pressure sensation in the upper chest area. She's noticed she's been a little bit short of breath here and there with it. She did have 2 episodes where she felt like her heart skipped a beat and then suddenly had a very forceful heartbeat. She says she's never had any cardiac problems in the past and has never had a history of palpitations that she knows of.  Past medical history, Surgical history, Family history not pertinant except as noted below, Social history, Allergies, and medications have been entered into the medical record, reviewed, and corrections made.   Review of Systems: No fevers, chills, night sweats, weight loss, chest pain, or shortness of breath.   Objective:    General: Well Developed, well nourished, and in no acute distress.  Neuro: Alert and oriented x3, extra-ocular muscles intact, sensation grossly intact.  HEENT: Normocephalic, atraumatic , Oropharynx is clear, TMs and canals are clear bilaterally. No significant cervical lymphadenopathy. Skin: Warm and dry, no rashes. He does have small erythematous approximately 3-4 mm papules scattered around her mouth area. Nothing on the rest of the face. None of them are vesicular on exam today. Cardiac: Regular rate and rhythm, no murmurs rubs or gallops, no lower extremity edema.  Respiratory: Clear to auscultation bilaterally. Not using accessory muscles, speaking in full sentences.   Impression and Recommendations:   URI - likely viral. Call if not  better by the end of the week.    Perioral dermatitis - unclear What is actually triggering her symptoms. Will treat with Protopic. If not covered by her insurance we can change it to a topical steroid cream.  Atypical CP- EKG with normal sinus rhythm and no acute ST-T wave changes. Normal axis. Gave reassurance. We will do some blood work today including a d-dimer's she's also has some associated shortness of breath.  Fatigue - check for anemia and thyroid disorder.

## 2016-05-20 LAB — COMPLETE METABOLIC PANEL WITH GFR
ALT: 18 U/L (ref 6–29)
AST: 15 U/L (ref 10–35)
Albumin: 4.4 g/dL (ref 3.6–5.1)
Alkaline Phosphatase: 70 U/L (ref 33–130)
BILIRUBIN TOTAL: 0.5 mg/dL (ref 0.2–1.2)
BUN: 13 mg/dL (ref 7–25)
CALCIUM: 9.3 mg/dL (ref 8.6–10.4)
CO2: 26 mmol/L (ref 20–31)
Chloride: 102 mmol/L (ref 98–110)
Creat: 0.82 mg/dL (ref 0.50–1.05)
GFR, Est Non African American: 83 mL/min (ref >=60)
Glucose, Bld: 214 mg/dL — ABNORMAL HIGH (ref 65–99)
Potassium: 4.8 mmol/L (ref 3.5–5.3)
Sodium: 138 mmol/L (ref 135–146)
Total Protein: 7 g/dL (ref 6.1–8.1)

## 2016-05-20 LAB — FERRITIN: FERRITIN: 45 ng/mL (ref 10–232)

## 2016-05-20 MED ORDER — TRIAMCINOLONE 0.1 % CREAM:EUCERIN CREAM 1:1: 1.0000 "application " | TOPICAL_CREAM | Freq: Every day | CUTANEOUS | 1 refills | Status: DC | PRN

## 2016-05-20 NOTE — Addendum Note (Signed)
Addended by: Beatrice Lecher D on: 05/20/2016 05:25 PM   Modules accepted: Orders

## 2016-05-20 NOTE — Progress Notes (Signed)
New rx sent

## 2016-05-26 ENCOUNTER — Ambulatory Visit (INDEPENDENT_AMBULATORY_CARE_PROVIDER_SITE_OTHER): Payer: BLUE CROSS/BLUE SHIELD | Admitting: Family Medicine

## 2016-05-26 VITALS — BP 139/86 | HR 96 | Wt 225.0 lb

## 2016-05-26 DIAGNOSIS — R7309 Other abnormal glucose: Secondary | ICD-10-CM

## 2016-05-26 DIAGNOSIS — E1165 Type 2 diabetes mellitus with hyperglycemia: Secondary | ICD-10-CM | POA: Diagnosis not present

## 2016-05-26 DIAGNOSIS — IMO0001 Reserved for inherently not codable concepts without codable children: Secondary | ICD-10-CM

## 2016-05-26 DIAGNOSIS — E119 Type 2 diabetes mellitus without complications: Secondary | ICD-10-CM | POA: Insufficient documentation

## 2016-05-26 LAB — POCT GLYCOSYLATED HEMOGLOBIN (HGB A1C): Hemoglobin A1C: 8.8

## 2016-05-26 MED ORDER — METFORMIN HCL 500 MG PO TABS: 500.0000 mg | ORAL_TABLET | Freq: Two times a day (BID) | ORAL | 2 refills | Status: DC

## 2016-05-26 MED ORDER — AMBULATORY NON FORMULARY MEDICATION: 99 refills | Status: AC

## 2016-05-26 NOTE — Addendum Note (Signed)
Addended by: Narda Rutherford on: 05/26/2016 03:47 PM   Modules accepted: Orders

## 2016-05-26 NOTE — Progress Notes (Signed)
Called and spoke with patient about results let her know that this does confirm a new diagnosis of diabetes. We'll send a prescription for low-dose metformin metformin. She is very nervous about trying it but I did encourage her because it does reduce risk of heart attack and stroke in the future if she is able to tolerate it. We'll also send over a prescription for glucometer. I like to see her back in about 3 weeks to follow-up and see how well she is doing. Also discussed the importance of dietary change in regular exercise. Also encouraged her to think about referral for diabetes nutrition and management. She will think about it.  Beatrice Lecher, MD

## 2016-05-26 NOTE — Progress Notes (Signed)
Jennifer Gray presents to the clinic for a hemoglobin A1c check.  Pt fasting glucose was elevated on 05/19/16.  Her A1c level today is 8.8.  Pt notified of these results while in office. Also pt was given her ferritin levels as well. Will route to PCP for further recommendations. -EMH/RMA

## 2016-06-20 ENCOUNTER — Other Ambulatory Visit: Payer: Self-pay | Admitting: Family Medicine

## 2016-07-18 ENCOUNTER — Telehealth: Payer: Self-pay | Admitting: *Deleted

## 2016-07-18 MED ORDER — NYSTATIN 100000 UNIT/GM EX CREA: 1.0000 "application " | TOPICAL_CREAM | Freq: Two times a day (BID) | CUTANEOUS | 0 refills | Status: DC

## 2016-07-18 MED ORDER — TRIAMCINOLONE 0.1 % CREAM:EUCERIN CREAM 1:1: 1.0000 "application " | TOPICAL_CREAM | Freq: Every day | CUTANEOUS | 1 refills | Status: DC | PRN

## 2016-07-18 NOTE — Telephone Encounter (Signed)
Is not helping then we may need to refer her to dermatology. Okay to place referral if she would like to.

## 2016-07-18 NOTE — Telephone Encounter (Signed)
Pt's husband calling clinic stating that pcp told his wife that Dr. Madilyn Fireman would send another med if the previous medication is not effective. He is asking that he receive a call in regards to change. Please see note below.  Looked back at [facility] note pcp suggested the following:  Perioral dermatitis - unclear What is actually triggering her symptoms. Will treat with Protopic. If not covered by her insurance we can change it to a topical steroid cream.  Will fwd back to pcp for f/u.Audelia Hives [place]

## 2016-07-18 NOTE — Telephone Encounter (Signed)
I think she is already using he steroid.  I don't know if the protopic wasn't covered or if was too expensive.  They can call the pharmacy and have them re-run the scrip.  It needs a PA then we can try to do one.  She also needs app for her diabetes. She is overdue to f/u appt.  We can try to treat more fungul since she has diabetes.  New rx sent to pharmacy.

## 2016-07-18 NOTE — Telephone Encounter (Signed)
Pt called and stated that the medication that was given for her face is not working and she would like something else to be sent in to her pharmacy. Will fwd to pcp.Jennifer Gray [place]

## 2016-07-18 NOTE — Telephone Encounter (Signed)
Pt informed that RX had been sent. She reports that the pharmacy never received the script. Advised that I would resend and she should p/u. Also asked if she scheduled an appt for f/u for dm she has an appt on 11/27.Jennifer Gray [place]

## 2016-07-28 ENCOUNTER — Ambulatory Visit (INDEPENDENT_AMBULATORY_CARE_PROVIDER_SITE_OTHER): Payer: BLUE CROSS/BLUE SHIELD | Admitting: Family Medicine

## 2016-07-28 ENCOUNTER — Encounter: Payer: Self-pay | Admitting: Family Medicine

## 2016-07-28 VITALS — BP 141/71 | HR 100 | Ht 68.0 in | Wt 225.0 lb

## 2016-07-28 DIAGNOSIS — L710 Perioral dermatitis: Secondary | ICD-10-CM

## 2016-07-28 DIAGNOSIS — E1165 Type 2 diabetes mellitus with hyperglycemia: Secondary | ICD-10-CM | POA: Diagnosis not present

## 2016-07-28 DIAGNOSIS — IMO0001 Reserved for inherently not codable concepts without codable children: Secondary | ICD-10-CM

## 2016-07-28 LAB — POCT GLYCOSYLATED HEMOGLOBIN (HGB A1C): Hemoglobin A1C: 7.8

## 2016-07-28 NOTE — Progress Notes (Signed)
Subjective:    CC: DM  HPI: Diabetes - no hypoglycemic events. No wounds or sores that are not healing well. No increased thirst or urination. Checking glucose at home. Taking medications as prescribed without any side effects.  Brpught in glucose log - average is 160s. He says she has tried to make some major dietary changes. She's cut back a lot on her breads and cost is. She is still drinking 3 diet Mountain Dew's a day but says she's trying to work on that as well. She is really cut back on her sweets intake as well. No regular exercise.  stilll has a rash around her mouth. She does not wear any lipstick her lip products except for one day a week. She is even switch to fluoride free toothpaste. The topical medications that given her have not helped. We have tried Elidel as well as triamcinolone. A little bit better when she went out of town for about a week.  Past medical history, Surgical history, Family history not pertinant except as noted below, Social history, Allergies, and medications have been entered into the medical record, reviewed, and corrections made.   Review of Systems: No fevers, chills, night sweats, weight loss, chest pain, or shortness of breath.   Objective:    General: Well Developed, well nourished, and in no acute distress.  Neuro: Alert and oriented x3, extra-ocular muscles intact, sensation grossly intact.  HEENT: Normocephalic, atraumatic  Skin: Warm and dry. He has a few erythematous dry papules around the mouth and a little bit of redness on the right upper eyelid. Cardiac: Regular rate and rhythm, no murmurs rubs or gallops, no lower extremity edema.  Respiratory: Clear to auscultation bilaterally. Not using accessory muscles, speaking in full sentences.   Impression and Recommendations:    DM- Improving. A1c down to 7.8 which is fantastic. TE work on diet and exercise and continue metformin. Try to increase the metformin twice a day if possible. Follow-up  in 3 months.  Perioral dermatitis-we'll refer to dermatology.

## 2016-10-02 ENCOUNTER — Encounter: Payer: Self-pay | Admitting: Osteopathic Medicine

## 2016-10-02 ENCOUNTER — Ambulatory Visit (INDEPENDENT_AMBULATORY_CARE_PROVIDER_SITE_OTHER): Payer: BLUE CROSS/BLUE SHIELD

## 2016-10-02 ENCOUNTER — Ambulatory Visit (INDEPENDENT_AMBULATORY_CARE_PROVIDER_SITE_OTHER): Payer: BLUE CROSS/BLUE SHIELD | Admitting: Osteopathic Medicine

## 2016-10-02 VITALS — BP 140/77 | HR 99 | Ht 69.0 in | Wt 223.0 lb

## 2016-10-02 DIAGNOSIS — R1031 Right lower quadrant pain: Secondary | ICD-10-CM

## 2016-10-02 DIAGNOSIS — R109 Unspecified abdominal pain: Secondary | ICD-10-CM | POA: Diagnosis not present

## 2016-10-02 DIAGNOSIS — N3090 Cystitis, unspecified without hematuria: Secondary | ICD-10-CM

## 2016-10-02 DIAGNOSIS — R102 Pelvic and perineal pain: Secondary | ICD-10-CM

## 2016-10-02 DIAGNOSIS — R300 Dysuria: Secondary | ICD-10-CM | POA: Diagnosis not present

## 2016-10-02 LAB — POCT URINALYSIS DIPSTICK
Bilirubin, UA: NEGATIVE
Glucose, UA: NEGATIVE
KETONES UA: NEGATIVE
Nitrite, UA: NEGATIVE
PH UA: 6
PROTEIN UA: NEGATIVE
SPEC GRAV UA: 1.01
Urobilinogen, UA: 0.2

## 2016-10-02 MED ORDER — SULFAMETHOXAZOLE-TRIMETHOPRIM 800-160 MG PO TABS: 1.0000 | ORAL_TABLET | Freq: Two times a day (BID) | ORAL | 0 refills | Status: DC

## 2016-10-02 NOTE — Progress Notes (Signed)
Chief Complaint: Possible UTI  History of Present Illness: Jennifer Gray is a 51 y.o. female who presents to [facility]  today with concerns for Chief Complaint  Patient presents with  . Back Pain    ABDOMINAL PAIN, ODOR WITH URINE    Onset: 1 weeks ago   Modifying factors/Context: states she called several days ago and was told to go to Urgent Care but she did not go (I don't see documentation of this in the charttried hydration and cranberry juice hoping this would help, but symptoms progressed.  Quality: Burning/Urgency, cloudy urine Assoc signs/symptoms: RLQ/pelvic pain  Complicated (any of the following): yes ?Diabetes ?Pregnancy ?Symptoms for seven or more days before seeking care ?Hospital acquired infection ?Renal failure ?Urinary tract obstruction ?Indwelling urethral catheter, stent, nephrostomy tube or urinary diversion ?Functional/anatomic abnormality of the urinary tract ?Renal transplantation ?Immunosuppression   Past medical, social and family history reviewed: Patient Active Problem List   Diagnosis Date Noted  . Diabetes type 2, uncontrolled ([place]) 05/26/2016  . Chronic constipation 02/13/2015  . Social anxiety disorder 08/01/2013  . Radiculitis of left cervical region 09/30/2012    Past Surgical History:  Procedure Laterality Date  . BACK SURGERY    . BREAST SURGERY     Social History  Substance Use Topics  . Smoking status: Former Smoker    Years: 10.00    Quit date: 09/17/2006  . Smokeless tobacco: Never Used  . Alcohol use No   The patient has a family history of  Current Outpatient Prescriptions  Medication Sig Dispense Refill  . AMBULATORY NON FORMULARY MEDICATION Medication Name: glucometer, lancets and strips to test once a day. Dx. Diabetes type 2. 90 days supply 1 Units PRN  . metFORMIN (GLUCOPHAGE) 500 MG tablet Take 1 tablet (500 mg total) by mouth 2 (two) times daily with a meal. 60 tablet 2  .  nystatin cream (MYCOSTATIN) Apply 1 application topically 2 (two) times daily. Apply to rash around her mouth 30 g 0  . PARoxetine (PAXIL) 40 MG tablet Take 1 tablet (40 mg total) by mouth daily. Needs follow up appointment. 90 tablet 0  . pimecrolimus (ELIDEL) 1 % cream Apply topically 2 (two) times daily. 30 g 0  . Triamcinolone Acetonide (TRIAMCINOLONE 0.1 % CREAM : EUCERIN) CREA Apply 1 application topically daily as needed. 30 g 1 each 1   No current facility-administered medications for this visit.    Allergies  Allergen Reactions  . Amoxicillin   . Codeine     headache  . Vicodin [Hydrocodone-Acetaminophen]      Review of Systems: CONSTITUTIONAL: Negative fever/chills CARDIAC: No chest pain/pressure/palpitations, no orthopnea RESPIRATORY: No cough/shortness of breath/wheeze GASTROINTESTINAL: No nausea/vomiting/abdominal pain/blood in stool/diarrhea/constipation MUSCULOSKELETAL: see below re: flank pain GENITOURINARY:    Frequency: yes  Hematuria: no  Odor: no  Incontinence: no  Flank Pain: no  Vaginal bleeding/discharge: no   Exam:  BP 140/77   Pulse 99   Ht 5\' 9"  (1.753 m)   Wt 223 lb (101.2 kg)   BMI 32.93 kg/m  Constitutional: VSS, see above. General Appearance: alert, well-developed, well-nourished, NAD Respiratory: Normal respiratory effort. Breath sounds normal, no wheeze/rhonchi/rales Cardiovascular: S1/S2 normal, no murmur/rub/gallop auscultated. RRR Gastrointestinal: (+)very TTP RLQ/pelvic, no masses. No hepatomegaly, no splenomegaly. No hernia appreciated. Rectal exam deferred. Bowel sounds normal x4 quadrants.   Musculoskeletal: Gait normal. No clubbing/cyanosis of digits. Lloyd sign Negative bilateral  Results for orders placed or performed in visit on 10/02/16 (from  the past 24 hour(s))  POCT Urinalysis Dipstick     Status: Abnormal   Collection Time: 10/02/16 11:33 AM  Result Value Ref Range   Color, UA YELLOW    Clarity, UA CLEAR    Glucose, UA  NEGATIVE    Bilirubin, UA NEGATIVE    Ketones, UA NEGATIVE    Spec Grav, UA 1.010    Blood, UA SMALL    pH, UA 6.0    Protein, UA NEGATIVE    Urobilinogen, UA 0.2    Nitrite, UA NEGATIVE    Leukocytes, UA moderate (2+) (A) Negative    Previous Culture Results: none   ASSESSMENT/PLAN: urinary symptoms started prior to pelvic pain, no vaginal or GI complaints, pt is reluctant to pursue expensive workup, will get [place] for limited further workup but may need to consider CT w/wo depending on results, pt would like to avoid cipro but may need to consider FQ therapy based on symptoms/culture. Pt advised if worsening pain, nausea, fever, or other concerns, she needs to seek care ASAP.   Cystitis - TMP-SMX cancelled, switch to Cipro  Abdominal pain, unspecified abdominal location - Plan: POCT Urinalysis Dipstick  Dysuria - Plan: Urine Culture  Pelvic pain - Plan: US Pelvis Complete, US Transvaginal Non-OB - pt declined CT, see phone note as well - calledher again to check up on symptoms 10/03/16 day after visit, still declines CT   Patient advised we will call with urine culture results once available, depending on results may need to change therapy. Return if symptoms worsen or fail to improve.

## 2016-10-02 NOTE — Patient Instructions (Signed)

## 2016-10-03 ENCOUNTER — Telehealth: Payer: Self-pay | Admitting: Osteopathic Medicine

## 2016-10-03 MED ORDER — CIPROFLOXACIN HCL 500 MG PO TABS: 500.0000 mg | ORAL_TABLET | Freq: Two times a day (BID) | ORAL | 0 refills | Status: DC

## 2016-10-03 NOTE — Telephone Encounter (Signed)
Called patient to check and see how UTI/pain symptoms were doing. Rx was inadvertently sent to mail order so treatment delayed another day - will go with Cipro to local pharmacy. Pain is same, no fever/nausea. [place] negative. Pt would like to hold off on CT, was educated we may be missing more serious problem such as renal stone, appendix or abscess pathology but reassuring that no fever or worsening of symptoms - to ER if worse.

## 2016-10-05 LAB — URINE CULTURE

## 2016-10-20 ENCOUNTER — Other Ambulatory Visit: Payer: Self-pay | Admitting: Family Medicine

## 2016-10-22 ENCOUNTER — Ambulatory Visit (INDEPENDENT_AMBULATORY_CARE_PROVIDER_SITE_OTHER): Payer: BLUE CROSS/BLUE SHIELD | Admitting: Family Medicine

## 2016-10-22 ENCOUNTER — Encounter: Payer: Self-pay | Admitting: Family Medicine

## 2016-10-22 VITALS — BP 121/74 | HR 95 | Ht 69.0 in | Wt 223.0 lb

## 2016-10-22 DIAGNOSIS — F4010 Social phobia, unspecified: Secondary | ICD-10-CM

## 2016-10-22 DIAGNOSIS — E1165 Type 2 diabetes mellitus with hyperglycemia: Secondary | ICD-10-CM | POA: Diagnosis not present

## 2016-10-22 DIAGNOSIS — F430 Acute stress reaction: Secondary | ICD-10-CM | POA: Diagnosis not present

## 2016-10-22 DIAGNOSIS — IMO0001 Reserved for inherently not codable concepts without codable children: Secondary | ICD-10-CM

## 2016-10-22 LAB — COMPLETE METABOLIC PANEL WITH GFR
ALBUMIN: 4.4 g/dL (ref 3.6–5.1)
ALT: 25 U/L (ref 6–29)
AST: 16 U/L (ref 10–35)
Alkaline Phosphatase: 70 U/L (ref 33–130)
BUN: 17 mg/dL (ref 7–25)
CALCIUM: 9.5 mg/dL (ref 8.6–10.4)
CO2: 21 mmol/L (ref 20–31)
CREATININE: 1.07 mg/dL — AB (ref 0.50–1.05)
Chloride: 104 mmol/L (ref 98–110)
GFR, EST AFRICAN AMERICAN: 69 mL/min (ref >=60)
GFR, EST NON AFRICAN AMERICAN: 60 mL/min (ref >=60)
Glucose, Bld: 179 mg/dL — ABNORMAL HIGH (ref 65–99)
Potassium: 4.5 mmol/L (ref 3.5–5.3)
Sodium: 139 mmol/L (ref 135–146)
Total Bilirubin: 0.4 mg/dL (ref 0.2–1.2)
Total Protein: 7.2 g/dL (ref 6.1–8.1)

## 2016-10-22 LAB — LIPID PANEL
CHOLESTEROL: 239 mg/dL — AB (ref <200)
HDL: 43 mg/dL — ABNORMAL LOW (ref >50)
LDL CALC: 161 mg/dL — AB (ref <100)
TRIGLYCERIDES: 176 mg/dL — AB (ref <150)
Total CHOL/HDL Ratio: 5.6 Ratio — ABNORMAL HIGH (ref <5.0)
VLDL: 35 mg/dL — ABNORMAL HIGH (ref <30)

## 2016-10-22 LAB — POCT UA - MICROALBUMIN
Albumin/Creatinine Ratio, Urine, POC: <30
Creatinine, POC: 200 mg/dL
Microalbumin Ur, POC: 10 mg/L

## 2016-10-22 LAB — POCT GLYCOSYLATED HEMOGLOBIN (HGB A1C): HEMOGLOBIN A1C: 7.7

## 2016-10-22 MED ORDER — METFORMIN HCL 500 MG PO TABS: 500.0000 mg | ORAL_TABLET | Freq: Two times a day (BID) | ORAL | 1 refills | Status: DC

## 2016-10-22 MED ORDER — ALPRAZOLAM 0.5 MG PO TABS: 0.5000 mg | ORAL_TABLET | ORAL | 0 refills | Status: DC | PRN

## 2016-10-22 MED ORDER — PAROXETINE HCL 40 MG PO TABS: ORAL_TABLET | ORAL | 1 refills | Status: DC

## 2016-10-22 MED ORDER — METFORMIN HCL 1000 MG PO TABS: 1000.0000 mg | ORAL_TABLET | Freq: Two times a day (BID) | ORAL | 0 refills | Status: DC

## 2016-10-22 NOTE — Progress Notes (Signed)
Subjective:    CC: DM  HPI: Diabetes - no hypoglycemic events. No wounds or sores that are not healing well. No increased thirst or urination. Checking glucose at home. Taking medications as prescribed without any side effects.She is currently on metformin 5 mg twice a day.She admit she has not been eating well. She's been stress eating as they recently put her mother in a nursing home and this is been very hard on her.Her mother has been calling multiple times a day and crying and taking to come home. This is been very emotional for her and she also thinks her mother is developing some early dementia.  Follow-up social anxiety disorder-she's currently on paroxetine 40 mg daily. Also requesting refill on her alprazolam. She says 30 tabs usually last her well over a year. The last time we felt it was in 2016.  Past medical history, Surgical history, Family history not pertinant except as noted below, Social history, Allergies, and medications have been entered into the medical record, reviewed, and corrections made.   Review of Systems: No fevers, chills, night sweats, weight loss, chest pain, or shortness of breath.   Objective:    General: Well Developed, well nourished, and in no acute distress.  Neuro: Alert and oriented x3, extra-ocular muscles intact, sensation grossly intact.  HEENT: Normocephalic, atraumatic  Skin: Warm and dry, no rashes. Cardiac: Regular rate and rhythm, no murmurs rubs or gallops, no lower extremity edema.  Respiratory: Clear to auscultation bilaterally. Not using accessory muscles, speaking in full sentences.   Impression and Recommendations:    Diabetes-due for urine microalbumin and eye exam. Reminded her to schedule this.  Uncontrolled. Increase metformin to 1000 mg. Continue current regimen. Follow up in  3-4 months. Encouraged healthy diet and regular exercise and to cut back on some of the unhealthy foods that she's been eating recently.  Social anxiety  disorder-will refill her paroxetine  as well as her alprazolam. Follow-up in 6 months.  Acute stress - Discussed ways to try to decrease her stress. I did encourage her to make sure that she is actually taking care of herself and try to get adequate rest and exercise.  She declines colonoscopy and Pap smear today. We discussed this.

## 2017-05-18 ENCOUNTER — Encounter: Payer: Self-pay | Admitting: Family Medicine

## 2017-05-18 ENCOUNTER — Ambulatory Visit (INDEPENDENT_AMBULATORY_CARE_PROVIDER_SITE_OTHER): Payer: BLUE CROSS/BLUE SHIELD | Admitting: Family Medicine

## 2017-05-18 ENCOUNTER — Ambulatory Visit (INDEPENDENT_AMBULATORY_CARE_PROVIDER_SITE_OTHER): Payer: BLUE CROSS/BLUE SHIELD

## 2017-05-18 VITALS — BP 122/81 | HR 98 | Temp 98.8°F | Wt 221.0 lb

## 2017-05-18 DIAGNOSIS — R109 Unspecified abdominal pain: Secondary | ICD-10-CM

## 2017-05-18 LAB — POCT URINALYSIS DIPSTICK
Bilirubin, UA: NEGATIVE
Blood, UA: NEGATIVE
GLUCOSE UA: NEGATIVE
Ketones, UA: NEGATIVE
LEUKOCYTES UA: NEGATIVE
Nitrite, UA: NEGATIVE
PROTEIN UA: NEGATIVE
UROBILINOGEN UA: 0.2 U/dL
pH, UA: 5.5 (ref 5.0–8.0)
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
## 2017-05-19 LAB — BASIC METABOLIC PANEL WITH GFR
BUN: 15 mg/dL (ref 7–25)
CALCIUM: 9.6 mg/dL (ref 8.6–10.4)
CO2: 26 mmol/L (ref 20–32)
Chloride: 100 mmol/L (ref 98–110)
Creat: 0.93 mg/dL (ref 0.50–1.05)
GFR, EST AFRICAN AMERICAN: 82 mL/min/{1.73_m2} (ref >=60)
GFR, EST NON AFRICAN AMERICAN: 71 mL/min/{1.73_m2} (ref >=60)
Glucose, Bld: 136 mg/dL — ABNORMAL HIGH (ref 65–99)
POTASSIUM: 4.6 mmol/L (ref 3.5–5.3)
Sodium: 137 mmol/L (ref 135–146)

## 2017-05-19 LAB — CBC WITH DIFFERENTIAL/PLATELET
BASOS PCT: 1.2 %
Basophils Absolute: 103 cells/uL (ref 0–200)
EOS PCT: 3.8 %
Eosinophils Absolute: 327 cells/uL (ref 15–500)
HEMATOCRIT: 38.5 % (ref 35.0–45.0)
HEMOGLOBIN: 12.6 g/dL (ref 11.7–15.5)
LYMPHS ABS: 2262 {cells}/uL (ref 850–3900)
MCH: 25.2 pg — ABNORMAL LOW (ref 27.0–33.0)
MCHC: 32.7 g/dL (ref 32.0–36.0)
MCV: 77 fL — AB (ref 80.0–100.0)
MPV: 9.3 fL (ref 7.5–12.5)
Monocytes Relative: 8.4 %
NEUTROS ABS: 5186 {cells}/uL (ref 1500–7800)
NEUTROS PCT: 60.3 %
Platelets: 445 10*3/uL — ABNORMAL HIGH (ref 140–400)
RBC: 5 10*6/uL (ref 3.80–5.10)
RDW: 13.3 % (ref 11.0–15.0)
Total Lymphocyte: 26.3 %
WBC mixed population: 722 cells/uL (ref 200–950)
WBC: 8.6 10*3/uL (ref 3.8–10.8)

## 2017-06-23 ENCOUNTER — Other Ambulatory Visit: Payer: Self-pay | Admitting: Family Medicine

## 2017-07-06 ENCOUNTER — Ambulatory Visit: Payer: BLUE CROSS/BLUE SHIELD | Admitting: Family Medicine

## 2017-07-06 ENCOUNTER — Other Ambulatory Visit (HOSPITAL_COMMUNITY)
Admission: RE | Admit: 2017-07-06 | Discharge: 2017-07-06 | Disposition: A | Discharge status: Home / Self Care | Payer: BLUE CROSS/BLUE SHIELD | Source: Ambulatory Visit | Attending: Family Medicine | Admitting: Family Medicine

## 2017-07-06 ENCOUNTER — Encounter: Payer: Self-pay | Admitting: Family Medicine

## 2017-07-06 ENCOUNTER — Ambulatory Visit (INDEPENDENT_AMBULATORY_CARE_PROVIDER_SITE_OTHER): Payer: BLUE CROSS/BLUE SHIELD

## 2017-07-06 VITALS — BP 120/82 | HR 96 | Temp 98.9°F | Ht 69.0 in | Wt 232.0 lb

## 2017-07-06 DIAGNOSIS — M546 Pain in thoracic spine: Secondary | ICD-10-CM

## 2017-07-06 DIAGNOSIS — M545 Low back pain, unspecified: Secondary | ICD-10-CM

## 2017-07-06 DIAGNOSIS — R8290 Unspecified abnormal findings in urine: Secondary | ICD-10-CM | POA: Diagnosis not present

## 2017-07-06 DIAGNOSIS — Z23 Encounter for immunization: Secondary | ICD-10-CM | POA: Diagnosis not present

## 2017-07-06 DIAGNOSIS — E11649 Type 2 diabetes mellitus with hypoglycemia without coma: Secondary | ICD-10-CM | POA: Diagnosis not present

## 2017-07-06 DIAGNOSIS — R350 Frequency of micturition: Secondary | ICD-10-CM

## 2017-07-06 DIAGNOSIS — Z124 Encounter for screening for malignant neoplasm of cervix: Secondary | ICD-10-CM

## 2017-07-06 DIAGNOSIS — G8929 Other chronic pain: Secondary | ICD-10-CM | POA: Diagnosis not present

## 2017-07-06 LAB — POCT URINALYSIS DIPSTICK
Bilirubin, UA: NEGATIVE
Glucose, UA: NEGATIVE
Ketones, UA: NEGATIVE
NITRITE UA: NEGATIVE
PH UA: 5.5 (ref 5.0–8.0)
PROTEIN UA: NEGATIVE
RBC UA: NEGATIVE
Spec Grav, UA: <=1.005 — AB (ref 1.010–1.025)
UROBILINOGEN UA: 0.2 U/dL

## 2017-07-06 LAB — POCT GLYCOSYLATED HEMOGLOBIN (HGB A1C): HEMOGLOBIN A1C: 7.8

## 2017-07-06 MED ORDER — METFORMIN HCL 500 MG PO TABS: 500.0000 mg | ORAL_TABLET | Freq: Two times a day (BID) | ORAL | 1 refills | Status: DC

## 2017-07-06 NOTE — Progress Notes (Signed)
Subjective:    Patient ID: Jennifer Gray, female    DOB: [birth year], 51 y.o.   MRN: [id number]  HPI 51 year old female comes in today with urinary frequency and odor as well as some low back pain for approximately 3 days.  No burning with urination.  I actually last saw her about 8 weeks ago for left flank pain and abdominal pain.  We did get a plain abdominal film at that time and there was diffuse stool throughout the colon so had recommended a trial of MiraLAX as well as an anti-inflammatory for possible muscular skeletal pain.  Since then she is still continued to have an odor to her urine but over the last 3 days her symptoms have gotten worse with bilateral low back pain and pain over the mid back where the bra strap hits when she flexes forward.  She denies any vaginal irritation or discharge or itching.  Review of Systems     BP 120/82   Pulse 96   Temp 98.9 F (37.2 C)   Ht 5\' 9"  (1.753 m)   Wt 232 lb (105.2 kg)   SpO2 99%   BMI 34.26 kg/m     Allergies  Allergen Reactions  . Amoxicillin Shortness Of Breath and Rash  . Codeine     headache  . Vicodin [Hydrocodone-Acetaminophen]     Past Medical History:  Diagnosis Date  . Anxiety     Past Surgical History:  Procedure Laterality Date  . BACK SURGERY    . BREAST SURGERY      Social History   Socioeconomic History  . Marital status: Married    Spouse name: Not on file  . Number of children: Not on file  . Years of education: Not on file  . Highest education level: Not on file  Social Needs  . Financial resource strain: Not on file  . Food insecurity - worry: Not on file  . Food insecurity - inability: Not on file  . Transportation needs - medical: Not on file  . Transportation needs - non-medical: Not on file  Occupational History  . Not on file  Tobacco Use  . Smoking status: Former Smoker    Years: 10.00    Last attempt to quit: 09/17/2006    Years since quitting: 10.8  . Smokeless tobacco:  Never Used  Substance and Sexual Activity  . Alcohol use: No  . Drug use: No  . Sexual activity: Not on file  Other Topics Concern  . Not on file  Social History Narrative  . Not on file    Family History  Problem Relation Age of Onset  . Hypertension Mother   . Cancer Father   . Diabetes Other     Outpatient Encounter Medications as of 07/06/2017  Medication Sig  . AMBULATORY NON FORMULARY MEDICATION Medication Name: glucometer, lancets and strips to test once a day. Dx. Diabetes type 2. 90 days supply  . metFORMIN (GLUCOPHAGE) 500 MG tablet Take 1 tablet (500 mg total) 2 (two) times daily with a meal by mouth.  Marland Kitchen PARoxetine (PAXIL) 40 MG tablet take 1 tablet by mouth daily  . [DISCONTINUED] metFORMIN (GLUCOPHAGE) 500 MG tablet Take 1 tablet (500 mg total) by mouth 2 (two) times daily with a meal. PLEASE CALL OFFICE AND SCHEDULE AN APPOINTMENT FOR ADDITIONAL REFILLS.  . [DISCONTINUED] ALPRAZolam (XANAX) 0.5 MG tablet Take 1 tablet (0.5 mg total) by mouth as needed for anxiety or sleep.   No facility-administered encounter medications  on file as of 07/06/2017.       Objective:   Physical Exam  Constitutional: She is oriented to person, place, and time. She appears well-developed and well-nourished.  HENT:  Head: Normocephalic and atraumatic.  Cardiovascular: Normal rate, regular rhythm and normal heart sounds.  Pulmonary/Chest: Effort normal and breath sounds normal.  Abdominal: Soft. She exhibits no distension and no mass. There is no tenderness. There is no rebound and no guarding.  Genitourinary: Vagina normal and uterus normal. There is no rash or tenderness on the right labia. There is no rash or tenderness on the left labia. Cervix exhibits no motion tenderness, no discharge and no friability. Right adnexum displays no mass, no tenderness and no fullness. Left adnexum displays no mass, no tenderness and no fullness. No erythema, tenderness or bleeding in the vagina. No  signs of injury around the vagina. No vaginal discharge found.  Musculoskeletal:  Normal flexion of lumbar spine.  decresed extension.  Normal rotation right and left and side bending. nontender over the spine.  Nontender paraspinous muscles. nontender over the SI joints.  LE strength is 5/5.   Neurological: She is alert and oriented to person, place, and time.  Skin: Skin is warm and dry.  Psychiatric: She has a normal mood and affect. Her behavior is normal.        Assessment & Plan:  Bilateral low back pain-really suspect this is probably coming from her lumbar spine.  The she also has pain just below the bra strap area in the thoracic spine when she flexes forward.  We will get x-rays as she does have some hardware in place.  She had surgery over 10 years ago.  Urinary odor-urinalysis is positive for leukocytes but negative for nitrates or blood.  We will go ahead and treat based on symptoms.  We will also do a wet prep just to rule out vaginitis which could be a cause of the odor.  If urine culture is negative then consider referral to urology.  Cervical cancer screening-last Pap smear was 8 years ago so we went ahead and completed that today as well will call with report once available.  Elevated BP - repeat BP normal.

## 2017-07-07 ENCOUNTER — Other Ambulatory Visit: Payer: Self-pay | Admitting: Family Medicine

## 2017-07-07 LAB — WET PREP FOR TRICH, YEAST, CLUE
MICRO NUMBER: 81240267
Specimen Quality: ADEQUATE

## 2017-07-07 MED ORDER — METRONIDAZOLE 500 MG PO TABS: 500.0000 mg | ORAL_TABLET | Freq: Two times a day (BID) | ORAL | 0 refills | Status: DC

## 2017-07-08 LAB — CYTOLOGY - PAP
Diagnosis: NEGATIVE
HPV: NOT DETECTED

## 2017-07-08 NOTE — Progress Notes (Signed)
Call patient: Your Pap smear is normal. Repeat in 5 years.

## 2017-07-27 ENCOUNTER — Telehealth: Payer: Self-pay

## 2017-07-27 ENCOUNTER — Encounter: Payer: Self-pay | Admitting: Family Medicine

## 2017-07-27 NOTE — Telephone Encounter (Signed)
[place] for letter for jury duty.

## 2017-07-27 NOTE — Telephone Encounter (Signed)
Letter written. Left VM advising Pt that letter is ready for pick up.

## 2017-07-27 NOTE — Telephone Encounter (Signed)
Pt called and wants to know if she can get a note to excuse her from jury duty. Pt states she has bad panic attacks and does not feel she will be able to attend. Please advise?

## 2017-08-03 ENCOUNTER — Telehealth: Payer: Self-pay | Admitting: Family Medicine

## 2017-08-03 NOTE — Telephone Encounter (Signed)
This is correct. My mistake. Thank you for the correction.Jennifer Gray

## 2017-08-03 NOTE — Telephone Encounter (Signed)
I had a note from Nurse to call patient and schedule a F/U appt on DM but patient informed that she was just here in November and had her A1c checked

## 2017-09-29 ENCOUNTER — Other Ambulatory Visit: Payer: Self-pay | Admitting: Family Medicine

## 2017-10-22 ENCOUNTER — Ambulatory Visit (INDEPENDENT_AMBULATORY_CARE_PROVIDER_SITE_OTHER): Payer: BLUE CROSS/BLUE SHIELD | Admitting: Family Medicine

## 2017-10-22 ENCOUNTER — Encounter: Payer: Self-pay | Admitting: Family Medicine

## 2017-10-22 VITALS — BP 122/66 | HR 100 | Ht 69.0 in | Wt 223.0 lb

## 2017-10-22 DIAGNOSIS — H9311 Tinnitus, right ear: Secondary | ICD-10-CM

## 2017-10-22 DIAGNOSIS — E11649 Type 2 diabetes mellitus with hypoglycemia without coma: Secondary | ICD-10-CM | POA: Diagnosis not present

## 2017-10-22 DIAGNOSIS — F4010 Social phobia, unspecified: Secondary | ICD-10-CM

## 2017-10-22 LAB — POCT UA - MICROALBUMIN
Albumin/Creatinine Ratio, Urine, POC: <30
Creatinine, POC: 200 mg/dL
MICROALBUMIN (UR) POC: 10 mg/L

## 2017-10-22 LAB — POCT GLYCOSYLATED HEMOGLOBIN (HGB A1C): HEMOGLOBIN A1C: 7.8

## 2017-10-22 MED ORDER — PREDNISONE 20 MG PO TABS: 40.0000 mg | ORAL_TABLET | Freq: Every day | ORAL | 0 refills | Status: DC

## 2017-10-22 MED ORDER — SITAGLIP PHOS-METFORMIN HCL ER 100-1000 MG PO TB24
1.0000 | ORAL_TABLET | Freq: Every day | ORAL | 5 refills | Status: DC
Start: 2017-10-22 — End: 2018-04-15

## 2017-10-22 MED ORDER — ALPRAZOLAM 0.5 MG PO TABS: 0.5000 mg | ORAL_TABLET | ORAL | 0 refills | Status: DC | PRN

## 2017-10-22 MED ORDER — PAROXETINE HCL 40 MG PO TABS: ORAL_TABLET | ORAL | 1 refills | Status: DC

## 2017-10-22 NOTE — Progress Notes (Signed)
Subjective:    CC: DM  HPI: Diabetes - no hypoglycemic events. No wounds or sores that are not healing well. No increased thirst or urination. Checking glucose at home. Taking medications as prescribed without any side effects.  F/U social anxiety d/o -she is currently on Paxil 40 mg.  Also went to discuss some ringing in her right ear for a couple of weeks.  It is coming and going.  She says she M is just feels like there is some fullness in her head though she has not had any recent cold symptoms.  No ear drainage.  No pain in the ear.  It just feels stopped up.  She has felt a little bit dizzy but no persistent vertigo since it started.  Past medical history, Surgical history, Family history not pertinant except as noted below, Social history, Allergies, and medications have been entered into the medical record, reviewed, and corrections made.   Review of Systems: No fevers, chills, night sweats, weight loss, chest pain, or shortness of breath.   Objective:    General: Well Developed, well nourished, and in no acute distress.  Neuro: Alert and oriented x3, extra-ocular muscles intact, sensation grossly intact.  HEENT: Normocephalic, atraumatic, oropharynx is clear, TMs and canals are clear bilaterally. Skin: Warm and dry, no rashes. Cardiac: Regular rate and rhythm, no murmurs rubs or gallops, no lower extremity edema.  Respiratory: Clear to auscultation bilaterally. Not using accessory muscles, speaking in full sentences.   Impression and Recommendations:    DM -uncontrolled.  We will change her to Janumet.  Follow-up in 3 months.  He had a discussion today about continuing to work on [employer] and low sugar diet and avoiding soda.  Encouraged her to really push water.   Social anxiety disorder-  PHQ 9 score of 12 and gad 7 score of 6.  She wants to continue her current regimen. Refilled her xanax.     Tinnitus, right ear-she also has some vertigo symptoms even though the ear  exam itself is completely normal we will go ahead and treat her with 5 days of prednisone.  If she is not better at that point she will give [place] a call back and will refer her to ENT for further evaluation.

## 2017-10-22 NOTE — Patient Instructions (Signed)
The ear ringing is not better in 1 week then please give [place] a call and will refer you to ENT for further evaluation.  Hopefully the prednisone will help.  You can also try taking a decongestant such as Sudafed if you would like.

## 2018-02-03 ENCOUNTER — Telehealth: Payer: Self-pay | Admitting: Family Medicine

## 2018-02-03 NOTE — Telephone Encounter (Signed)
Call pt: based on her dx of diabetes guidelines show that being on a statin reduces her risk of heart attack by 20%. I stronglhy encourage her to consider starting a statin at bedtime. If she is [place] let me know.

## 2018-02-04 NOTE — Telephone Encounter (Signed)
Left a message advising of recommendations and for a return call.

## 2018-03-05 ENCOUNTER — Ambulatory Visit (INDEPENDENT_AMBULATORY_CARE_PROVIDER_SITE_OTHER): Payer: BLUE CROSS/BLUE SHIELD | Admitting: Family Medicine

## 2018-03-05 ENCOUNTER — Encounter: Payer: Self-pay | Admitting: Family Medicine

## 2018-03-05 VITALS — BP 127/73 | HR 99 | Ht 69.0 in | Wt 219.0 lb

## 2018-03-05 DIAGNOSIS — N912 Amenorrhea, unspecified: Secondary | ICD-10-CM

## 2018-03-05 DIAGNOSIS — E11649 Type 2 diabetes mellitus with hypoglycemia without coma: Secondary | ICD-10-CM

## 2018-03-05 DIAGNOSIS — R319 Hematuria, unspecified: Secondary | ICD-10-CM

## 2018-03-05 DIAGNOSIS — F4010 Social phobia, unspecified: Secondary | ICD-10-CM

## 2018-03-05 DIAGNOSIS — R8290 Unspecified abnormal findings in urine: Secondary | ICD-10-CM | POA: Diagnosis not present

## 2018-03-05 DIAGNOSIS — R232 Flushing: Secondary | ICD-10-CM

## 2018-03-05 DIAGNOSIS — M7701 Medial epicondylitis, right elbow: Secondary | ICD-10-CM

## 2018-03-05 DIAGNOSIS — R351 Nocturia: Secondary | ICD-10-CM | POA: Diagnosis not present

## 2018-03-05 DIAGNOSIS — R5383 Other fatigue: Secondary | ICD-10-CM

## 2018-03-05 LAB — POCT URINALYSIS DIPSTICK
Bilirubin, UA: NEGATIVE
Glucose, UA: NEGATIVE
KETONES UA: NEGATIVE
LEUKOCYTES UA: NEGATIVE
Nitrite, UA: NEGATIVE
PROTEIN UA: NEGATIVE
SPEC GRAV UA: 1.025 (ref 1.010–1.025)
Urobilinogen, UA: 0.2 E.U./dL
pH, UA: 6 (ref 5.0–8.0)

## 2018-03-05 LAB — POCT GLYCOSYLATED HEMOGLOBIN (HGB A1C): Hemoglobin A1C: 7.3 % — AB (ref 4.0–5.6)

## 2018-03-05 MED ORDER — RANITIDINE HCL 150 MG PO TABS: 150.0000 mg | ORAL_TABLET | Freq: Two times a day (BID) | ORAL | 1 refills | Status: DC

## 2018-03-05 NOTE — Progress Notes (Signed)
Subjective:    CC: CM  HPI:  Diabetes - no hypoglycemic events. No wounds or sores that are not healing well. No increased thirst or urination. Checking glucose at home. Taking medications as prescribed without any side effects.  Try to contact her about starting a statin.  Is concerned because her blood sugars have been swinging.  She is been as low as in the 90s as high as in the 170s.  She says up until about 2 weeks ago that she was mainly around the 130s and 140s but then suddenly 2 weeks ago she has had these wild fluctuations in blood sugars.  She has not changed how she is taking her medication.  Social anxiety disorder -okay overall.  She has not felt more depressed than usual.  She just does not feel well.  She has been very tired for several months but over the last 2 weeks is gotten significantly worse.  She complains of early satiety.  She says she will eat and then just feel really full and not want to finish eating.  She is really been snacking for her breakfast and lunch and then has a full dinner but eats very little throughout the day.  She is felt super fatigued and tired to the point of being extreme.  She is been very nauseated.  She reports urinating 4-5 times per night over the last 2 weeks as well as having an odor to her urine but no dysuria no blood in the urine.  She expenses reflux after eating.  She did buy some Tagamet over-the-counter but has been taking that as needed but it does seem to help when she does take it.  She has a history of chronic constipation as an adult but more recently has been experiencing intermittent diarrhea between hard stools.  She went over so if her hormones could be affecting how she is feeling as well.  She does experience occasional hot flashes and some mood swings though no major changes in her mood or depression.  She is only had one.  This year.  She is having pain over the ileal side of her right elbow.  She has been picking up her  grandbaby a lot.  No other trauma or injury.  She has not really tried any treatments but says is very sore to touch.  It also bothers her when she is holding up her [place] or her phone.  Also notes multiple tiny what look like hemangiomas on her abdomen.  They are very concentrated and there is literally probably 1000 of them on her abdomen but only a few scattered on her legs and other extremities.  BP 127/73   Pulse 99   Ht 5\' 9"  (1.753 m)   Wt 219 lb (99.3 kg)   SpO2 98%   BMI 32.34 kg/m     Allergies  Allergen Reactions  . Amoxicillin Shortness Of Breath and Rash  . Codeine     headache  . Vicodin [Hydrocodone-Acetaminophen]     Past Medical History:  Diagnosis Date  . Anxiety     Past Surgical History:  Procedure Laterality Date  . BACK SURGERY    . BREAST SURGERY      Social History   Socioeconomic History  . Marital status: Married    Spouse name: Not on file  . Number of children: Not on file  . Years of education: Not on file  . Highest education level: Not on file  Occupational History  .  Not on file  Social Needs  . Financial resource strain: Not on file  . Food insecurity:    Worry: Not on file    Inability: Not on file  . Transportation needs:    Medical: Not on file    Non-medical: Not on file  Tobacco Use  . Smoking status: Former Smoker    Years: 10.00    Last attempt to quit: 09/17/2006    Years since quitting: 11.4  . Smokeless tobacco: Never Used  Substance and Sexual Activity  . Alcohol use: No  . Drug use: No  . Sexual activity: Not on file  Lifestyle  . Physical activity:    Days per week: Not on file    Minutes per session: Not on file  . Stress: Not on file  Relationships  . Social connections:    Talks on phone: Not on file    Gets together: Not on file    Attends religious service: Not on file    Active member of club or organization: Not on file    Attends meetings of clubs or organizations: Not on file    Relationship  status: Not on file  . Intimate partner violence:    Fear of current or ex partner: Not on file    Emotionally abused: Not on file    Physically abused: Not on file    Forced sexual activity: Not on file  Other Topics Concern  . Not on file  Social History Narrative  . Not on file    Family History  Problem Relation Age of Onset  . Hypertension Mother   . Cancer Father   . Diabetes Other     Outpatient Encounter Medications as of 03/05/2018  Medication Sig  . ALPRAZolam (XANAX) 0.5 MG tablet Take 1 tablet (0.5 mg total) by mouth as needed for anxiety or sleep.  . AMBULATORY NON FORMULARY MEDICATION Medication Name: glucometer, lancets and strips to test once a day. Dx. Diabetes type 2. 90 days supply  . metFORMIN (GLUCOPHAGE) 500 MG tablet Take 1 tablet (500 mg total) 2 (two) times daily with a meal by mouth.  Marland Kitchen PARoxetine (PAXIL) 40 MG tablet TAKE 1 TABLET BY MOUTH ONCE DAILY.  Marland Kitchen SitaGLIPtin-MetFORMIN HCl (JANUMET XR) [phone] MG TB24 Take 1 tablet by mouth daily.  . ranitidine (ZANTAC) 150 MG tablet Take 1 tablet (150 mg total) by mouth 2 (two) times daily.  . [DISCONTINUED] predniSONE (DELTASONE) 20 MG tablet Take 2 tablets (40 mg total) by mouth daily with breakfast.   No facility-administered encounter medications on file as of 03/05/2018.        Objective:    General: Well Developed, well nourished, and in no acute distress.  Neuro: Alert and oriented x3, extra-ocular muscles intact, sensation grossly intact.  HEENT: Normocephalic, atraumatic  Skin: Warm and dry, no rashes. Cardiac: Regular rate and rhythm, no murmurs rubs or gallops, no lower extremity edema.  Respiratory: Clear to auscultation bilaterally. Not using accessory muscles, speaking in full sentences. MSK: Under of the medial epicondyle in the right elbow.  Normal range of motion and strength.  She has pain more with pronation versus supination.   Impression and Recommendations:    DM -globin A1c overall  improved at 7.3 today but still not at goal.  She is otherwise been tolerating the Janumet well though she is had significant fluctuations in blood sugars over the last 2 weeks without any good explanation.  Early satiety/nausea-suspect could be related to constipation.  And though she is been having some diarrhea in between hard stools I suspect that she still has significant constipation and recommend a trial of MiraLAX to help clean out her bowels.  Social anxiety disorder- Stable.  Continue current regimen.  Nocturia/Urine odor -we will check urinalysis and culture for possible urinary tract infection.  Urinalysis positive for leukocytes and trace blood.  Will send for urine culture and microscopic review  Hot flashes with amenorrhea-we will check her hormone levels.  GERD-we will go ahead and put her on ranitidine twice a day for the next month.  Medial epicondylitis, right-most consistent with golfer's elbow.  Discussed treatment.  Typically we would use an anti-inflammatory the right now she is having a lot of reflux symptoms.  Gave her stretches and exercises to do on her own at home.  If not improving then please see me back.  Possible petechiae-the lesions look almost like a small hand hemangiomas and she actually has a few cherry angiomas but some almost look like petechiae but the really only focused on her abdomen.  Check liver enzymes and renal function.  Time spent 40 minutes, greater than half the time spent counseling about early satiety, nausea, chronic constipation, social anxiety disorder, nocturia, hot flashes, GERD, medial epicondylitis, rash

## 2018-03-05 NOTE — Patient Instructions (Addendum)
Recommend MiraLAX, 1 capful mixed with 6 ounces of fluid nightly for the next 2 weeks to completely clean out the bowel.

## 2018-03-06 LAB — URINE CULTURE
MICRO NUMBER:: 90799324
SPECIMEN QUALITY:: ADEQUATE

## 2018-03-09 LAB — LIPID PANEL
CHOL/HDL RATIO: 5.3 (calc) — AB (ref <5.0)
Cholesterol: 230 mg/dL — ABNORMAL HIGH (ref <200)
HDL: 43 mg/dL — AB (ref >50)
LDL CHOLESTEROL (CALC): 155 mg/dL — AB
NON-HDL CHOLESTEROL (CALC): 187 mg/dL — AB (ref <130)
TRIGLYCERIDES: 188 mg/dL — AB (ref <150)

## 2018-03-09 LAB — COMPLETE METABOLIC PANEL WITH GFR
AG RATIO: 1.9 (calc) (ref 1.0–2.5)
ALT: 34 U/L — AB (ref 6–29)
AST: 17 U/L (ref 10–35)
Albumin: 4.5 g/dL (ref 3.6–5.1)
Alkaline phosphatase (APISO): 57 U/L (ref 33–130)
BUN: 17 mg/dL (ref 7–25)
CALCIUM: 9.5 mg/dL (ref 8.6–10.4)
CO2: 22 mmol/L (ref 20–32)
CREATININE: 0.95 mg/dL (ref 0.50–1.05)
Chloride: 105 mmol/L (ref 98–110)
GFR, EST AFRICAN AMERICAN: 79 mL/min/{1.73_m2} (ref >=60)
GFR, EST NON AFRICAN AMERICAN: 68 mL/min/{1.73_m2} (ref >=60)
GLOBULIN: 2.4 g/dL (ref 1.9–3.7)
Glucose, Bld: 160 mg/dL — ABNORMAL HIGH (ref 65–139)
POTASSIUM: 4.5 mmol/L (ref 3.5–5.3)
SODIUM: 140 mmol/L (ref 135–146)
TOTAL PROTEIN: 6.9 g/dL (ref 6.1–8.1)
Total Bilirubin: 0.4 mg/dL (ref 0.2–1.2)

## 2018-03-09 LAB — CBC
HEMATOCRIT: 39.3 % (ref 35.0–45.0)
HEMOGLOBIN: 13.2 g/dL (ref 11.7–15.5)
MCH: 25.3 pg — ABNORMAL LOW (ref 27.0–33.0)
MCHC: 33.6 g/dL (ref 32.0–36.0)
MCV: 75.3 fL — AB (ref 80.0–100.0)
MPV: 9.7 fL (ref 7.5–12.5)
Platelets: 422 10*3/uL — ABNORMAL HIGH (ref 140–400)
RBC: 5.22 10*6/uL — ABNORMAL HIGH (ref 3.80–5.10)
RDW: 14 % (ref 11.0–15.0)
WBC: 7.1 10*3/uL (ref 3.8–10.8)

## 2018-03-09 LAB — B12 AND FOLATE PANEL
Folate: 12.4 ng/mL
VITAMIN B 12: 545 pg/mL (ref 200–1100)

## 2018-03-09 LAB — FERRITIN: FERRITIN: 43 ng/mL (ref 16–232)

## 2018-03-09 LAB — LUTEINIZING HORMONE: LH: 33 m[IU]/mL

## 2018-03-09 LAB — PROGESTERONE

## 2018-03-09 LAB — FOLLICLE STIMULATING HORMONE: FSH: 57.3 m[IU]/mL

## 2018-03-09 LAB — TSH: TSH: 3.8 mIU/L

## 2018-03-09 LAB — SEDIMENTATION RATE: Sed Rate: 6 mm/h (ref 0–30)

## 2018-03-09 LAB — ESTRADIOL: Estradiol: 18 pg/mL

## 2018-03-10 NOTE — Progress Notes (Signed)
A1C is 7.3, this is improved, but not at goal <7.0. Would recommend increasing Janumet to 2 tablets daily LDL cholesterol is 155; it should be less than 70 for people with diabetes. Recommend cholesterol-lowering medication in addition to [place], regular aerobic exercise and weight loss. Hormone levels are in the postmenopausal range

## 2018-03-14 ENCOUNTER — Other Ambulatory Visit: Payer: Self-pay | Admitting: Family Medicine

## 2018-03-14 MED ORDER — METFORMIN HCL ER 500 MG PO TB24: 500.0000 mg | ORAL_TABLET | Freq: Every day | ORAL | 1 refills | Status: DC

## 2018-03-14 NOTE — Addendum Note (Signed)
Addended by: Beatrice Lecher D on: 03/14/2018 08:16 PM   Modules accepted: Orders

## 2018-03-18 ENCOUNTER — Ambulatory Visit: Payer: BLUE CROSS/BLUE SHIELD | Admitting: Family Medicine

## 2018-04-08 ENCOUNTER — Ambulatory Visit (INDEPENDENT_AMBULATORY_CARE_PROVIDER_SITE_OTHER): Payer: BLUE CROSS/BLUE SHIELD | Admitting: Family Medicine

## 2018-04-08 ENCOUNTER — Encounter: Payer: Self-pay | Admitting: Family Medicine

## 2018-04-08 VITALS — BP 135/75 | HR 102 | Ht 69.0 in | Wt 217.0 lb

## 2018-04-08 DIAGNOSIS — E11649 Type 2 diabetes mellitus with hypoglycemia without coma: Secondary | ICD-10-CM | POA: Diagnosis not present

## 2018-04-08 MED ORDER — GLUCOSE BLOOD VI STRP: ORAL_STRIP | 12 refills | Status: DC

## 2018-04-08 MED ORDER — PAROXETINE HCL 40 MG PO TABS: ORAL_TABLET | ORAL | 1 refills | Status: DC

## 2018-04-08 MED ORDER — ACCU-CHEK SOFTCLIX LANCETS MISC: 12 refills | Status: AC

## 2018-04-08 NOTE — Patient Instructions (Signed)
Can try IB Guard - 1-2 capsules with each meal ( 3 x a day) for IBS

## 2018-04-08 NOTE — Progress Notes (Signed)
 Subjective:    Patient ID: Jennifer Gray, female    DOB: 10/28/1965, 52 y.o.   MRN: [id number]  HPI 52-year-old female comes in today for follow-up.  When she came in for her diabetic visit last time she had several things going on in 1 to come back in follow-up on them and make sure that she was improving.  She did want to let me know that she is doing well on the Janumet.  When her A1c came back elevated we also added metformin.  She is been tolerating this well.  In regards to her change in bowels recently she says things seem to be about the same.  When I last saw her she had more recently started having diarrhea intermittent with constipation.  Before that she was had she had a chronic lifelong constipation that started when she was a child.  She said often times she would go up to a month without a bowel movement.  The more loose stools did start after starting the Janumet.  Because she was still having some intermittent hard stools I had her do MiraLAX nightly for 2 weeks.  She does feel like her diet is a lot softer and a lot of the bloating and distention has improved.  She is actually lost several pounds.  When she does have the diarrhea she gets abdominal pain and cramping and then after she has a bowel movement she gets significant relief in her symptoms she just feels tired afterwards.  GERD-we also started ranitidine when I last saw her.  Her symptoms do seem to be better.  Review of Systems  BP 135/75   Pulse (!) 102   Ht 5' 9" (1.753 m)   Wt 217 lb (98.4 kg)   SpO2 97%   BMI 32.05 kg/m     Allergies  Allergen Reactions  . Amoxicillin Shortness Of Breath and Rash  . Codeine     headache  . Vicodin [Hydrocodone-Acetaminophen]     Past Medical History:  Diagnosis Date  . Anxiety     Past Surgical History:  Procedure Laterality Date  . BACK SURGERY    . BREAST SURGERY      Social History   Socioeconomic History  . Marital status: Married    Spouse name:  Not on file  . Number of children: Not on file  . Years of education: Not on file  . Highest education level: Not on file  Occupational History  . Not on file  Social Needs  . Financial resource strain: Not on file  . Food insecurity:    Worry: Not on file    Inability: Not on file  . Transportation needs:    Medical: Not on file    Non-medical: Not on file  Tobacco Use  . Smoking status: Former Smoker    Years: 10.00    Last attempt to quit: 09/17/2006    Years since quitting: 11.5  . Smokeless tobacco: Never Used  Substance and Sexual Activity  . Alcohol use: No  . Drug use: No  . Sexual activity: Not on file  Lifestyle  . Physical activity:    Days per week: Not on file    Minutes per session: Not on file  . Stress: Not on file  Relationships  . Social connections:    Talks on phone: Not on file    Gets together: Not on file    Attends religious service: Not on file    Active member   of club or organization: Not on file    Attends meetings of clubs or organizations: Not on file    Relationship status: Not on file  . Intimate partner violence:    Fear of current or ex partner: Not on file    Emotionally abused: Not on file    Physically abused: Not on file    Forced sexual activity: Not on file  Other Topics Concern  . Not on file  Social History Narrative  . Not on file    Family History  Problem Relation Age of Onset  . Hypertension Mother   . Cancer Father   . Diabetes Other     Outpatient Encounter Medications as of 04/08/2018  Medication Sig  . ALPRAZolam (XANAX) 0.5 MG tablet Take 1 tablet (0.5 mg total) by mouth as needed for anxiety or sleep.  . AMBULATORY NON FORMULARY MEDICATION Medication Name: glucometer, lancets and strips to test once a day. Dx. Diabetes type 2. 90 days supply  . metFORMIN (GLUCOPHAGE-XR) 500 MG 24 hr tablet Take 1 tablet (500 mg total) by mouth daily with breakfast.  . PARoxetine (PAXIL) 40 MG tablet TAKE 1 TABLET BY MOUTH ONCE  DAILY.  . ranitidine (ZANTAC) 150 MG tablet Take 1 tablet (150 mg total) by mouth 2 (two) times daily.  . SitaGLIPtin-MetFORMIN HCl (JANUMET XR) [phone] MG TB24 Take 1 tablet by mouth daily.  . [DISCONTINUED] PARoxetine (PAXIL) 40 MG tablet TAKE 1 TABLET BY MOUTH ONCE DAILY.  Marland Kitchen ACCU-CHEK SOFTCLIX LANCETS lancets For testing once a day. Dx:E11.65  . glucose blood (ACCU-CHEK AVIVA) test strip For testing once a day. Dx:E11.65   No facility-administered encounter medications on file as of 04/08/2018.          Objective:   Physical Exam  Constitutional: She is oriented to person, place, and time. She appears well-developed and well-nourished.  HENT:  Head: Normocephalic and atraumatic.  Eyes: Conjunctivae and EOM are normal.  Cardiovascular: Normal rate.  Pulmonary/Chest: Effort normal.  Neurological: She is alert and oriented to person, place, and time.  Skin: Skin is warm and dry. No pallor.  Psychiatric: She has a normal mood and affect. Her behavior is normal.  Vitals reviewed.         Assessment & Plan:  Change in bowels-consider that she could have IBS.  She does alternate between diarrhea and constipation.  Also consider that it could be a medication side effect.  We discussed options including a trial of IBgard which is peppermint oil.  She is already on Paxil which is an SSRI.  We could always refer her to GI as well.  She is due for colon cancer screening anyway.  She did receive the Cologuard kit that we had ordered but then found that it was not covered by [employer] so she is considering doing a colonoscopy later this fall.  GERD-we will continue ranitidine for now.  Diabetes-continue with Janumet and the extra metformin dose.  She is also lost a little bit of weight and I am hoping that her next A1c will look great.

## 2018-04-15 ENCOUNTER — Other Ambulatory Visit: Payer: Self-pay | Admitting: Family Medicine

## 2018-05-20 ENCOUNTER — Ambulatory Visit: Payer: BLUE CROSS/BLUE SHIELD | Admitting: Family Medicine

## 2018-05-20 ENCOUNTER — Encounter: Payer: Self-pay | Admitting: Family Medicine

## 2018-05-20 VITALS — BP 138/76 | HR 100 | Ht 69.0 in | Wt 216.0 lb

## 2018-05-20 DIAGNOSIS — E11649 Type 2 diabetes mellitus with hypoglycemia without coma: Secondary | ICD-10-CM

## 2018-05-20 DIAGNOSIS — F4010 Social phobia, unspecified: Secondary | ICD-10-CM

## 2018-05-20 LAB — POCT GLYCOSYLATED HEMOGLOBIN (HGB A1C): Hemoglobin A1C: 7 % — AB (ref 4.0–5.6)

## 2018-05-20 MED ORDER — LIRAGLUTIDE 18 MG/3ML ~~LOC~~ SOPN: PEN_INJECTOR | SUBCUTANEOUS | 0 refills | Status: DC

## 2018-05-20 MED ORDER — PROMETHAZINE HCL 25 MG PO TABS: 25.0000 mg | ORAL_TABLET | Freq: Three times a day (TID) | ORAL | 0 refills | Status: DC | PRN

## 2018-05-20 NOTE — Progress Notes (Signed)
Subjective:    CC: DM  HPI:  Diabetes - no hypoglycemic events. No wounds or sores that are not healing well. No increased thirst or urination. Checking glucose at home. Taking medications as prescribed without any side effects. She has made some dietary issues.  We had added metformin to her Janumet.  She has been tolerating it well.    F/U social anxiety d/o she is doing well on paxil.  Happy with current medication.    Past medical history, Surgical history, Family history not pertinant except as noted below, Social history, Allergies, and medications have been entered into the medical record, reviewed, and corrections made.   Review of Systems: No fevers, chills, night sweats, weight loss, chest pain, or shortness of breath.   Objective:    General: Well Developed, well nourished, and in no acute distress.  Neuro: Alert and oriented x3, extra-ocular muscles intact, sensation grossly intact.  HEENT: Normocephalic, atraumatic  Skin: Warm and dry, no rashes. Cardiac: Regular rate and rhythm, no murmurs rubs or gallops, no lower extremity edema.  Respiratory: Clear to auscultation bilaterally. Not using accessory muscles, speaking in full sentences.   Impression and Recommendations:    DM - Uncontrolled. Though A1C is better today. We discussed options.  Discussed adding another medication for better control. Will start Januvia. Warned about potential side effect. She is very fearful of vomiting so we will send over a couple tabs of Phenergan that she can use if needed.  Social Anxiety D/O  - doing well on paxil

## 2018-06-17 ENCOUNTER — Ambulatory Visit: Payer: BLUE CROSS/BLUE SHIELD | Admitting: Family Medicine

## 2018-06-17 ENCOUNTER — Encounter: Payer: Self-pay | Admitting: Family Medicine

## 2018-06-17 VITALS — BP 126/76 | HR 103 | Ht 69.0 in | Wt 211.0 lb

## 2018-06-17 DIAGNOSIS — E11649 Type 2 diabetes mellitus with hypoglycemia without coma: Secondary | ICD-10-CM | POA: Diagnosis not present

## 2018-06-17 DIAGNOSIS — K146 Glossodynia: Secondary | ICD-10-CM | POA: Diagnosis not present

## 2018-06-17 DIAGNOSIS — Z23 Encounter for immunization: Secondary | ICD-10-CM

## 2018-06-17 NOTE — Progress Notes (Signed)
Subjective:    CC:   HPI:  Diabetes - no hypoglycemic events. No wounds or sores that are not healing well. No increased thirst or urination. Checking glucose at home. Taking medications as prescribed without any side effects.  He is up to 1.8 mg of Victoza and still taking Janumet.  She was taking the Janumet in the morning and a separate metformin in the evening but she has stopped the metformin in the evening because her stomach was just feeling really upset.  She has felt better since dropping off the metformin.  She did have a glucose of 81 the other morning.  But has not had anything lower than that.  She has noted a burning tongue sensation.  She says it only seems to happen when she drinks diet soda.  She says the tongue will look red and splotchy and feel like it is burning almost like it got burnt.  No other foods seem to cause this.  She just had a normal B12 level 3 months ago.  Past medical history, Surgical history, Family history not pertinant except as noted below, Social history, Allergies, and medications have been entered into the medical record, reviewed, and corrections made.   Review of Systems: No fevers, chills, night sweats, weight loss, chest pain, or shortness of breath.   Objective:    General: Well Developed, well nourished, and in no acute distress.  Neuro: Alert and oriented x3, extra-ocular muscles intact, sensation grossly intact.  HEENT: Normocephalic, atraumatic  Skin: Warm and dry, no rashes. Cardiac: Regular rate and rhythm, no murmurs rubs or gallops, no lower extremity edema.  Respiratory: Clear to auscultation bilaterally. Not using accessory muscles, speaking in full sentences.   Impression and Recommendations:    DM -last A1c was at 7.0 not well controlled but she has done well with the addition of the Victoza and is up to 1.8 mg.  I do want her to monitor for any hypoglycemia if she starts to see some low blood sugars that we may need to back her  dose down to 1.2.  We did discontinue the evening metformin and she is just doing the Janumet in the morning.  I will see her back in about 2 months at that point she will be due for another hemoglobin A 1C and I think it will look fantastic make [place] on her home blood glucose levels.  Tongue burning sensation -clear etiology it only happens with soda so just encouraged her to not drink soda.  Recent B12 level 3 months ago was actually normal.

## 2018-07-03 ENCOUNTER — Other Ambulatory Visit: Payer: Self-pay | Admitting: Family Medicine

## 2018-08-18 ENCOUNTER — Telehealth: Payer: Self-pay | Admitting: Family Medicine

## 2018-08-18 NOTE — Telephone Encounter (Signed)
Left brief VM for patient that we received a notification from [employer] that a kit was not shipped back to their office. Patient was asked to call back with any questions.

## 2018-08-22 ENCOUNTER — Other Ambulatory Visit: Payer: Self-pay | Admitting: Family Medicine

## 2018-09-09 ENCOUNTER — Ambulatory Visit: Payer: BLUE CROSS/BLUE SHIELD | Admitting: Family Medicine

## 2018-09-17 ENCOUNTER — Ambulatory Visit (INDEPENDENT_AMBULATORY_CARE_PROVIDER_SITE_OTHER): Payer: BLUE CROSS/BLUE SHIELD | Admitting: Family Medicine

## 2018-09-17 ENCOUNTER — Encounter: Payer: Self-pay | Admitting: Family Medicine

## 2018-09-17 VITALS — BP 122/66 | HR 101 | Ht 69.0 in | Wt 210.0 lb

## 2018-09-17 DIAGNOSIS — F4010 Social phobia, unspecified: Secondary | ICD-10-CM | POA: Diagnosis not present

## 2018-09-17 DIAGNOSIS — E119 Type 2 diabetes mellitus without complications: Secondary | ICD-10-CM

## 2018-09-17 DIAGNOSIS — L709 Acne, unspecified: Secondary | ICD-10-CM | POA: Diagnosis not present

## 2018-09-17 LAB — POCT GLYCOSYLATED HEMOGLOBIN (HGB A1C): HEMOGLOBIN A1C: 6.3 % — AB (ref 4.0–5.6)

## 2018-09-17 MED ORDER — SITAGLIP PHOS-METFORMIN HCL ER 100-1000 MG PO TB24: 1.0000 | ORAL_TABLET | Freq: Every day | ORAL | 1 refills | Status: DC

## 2018-09-17 MED ORDER — PAROXETINE HCL 40 MG PO TABS: ORAL_TABLET | ORAL | 1 refills | Status: DC

## 2018-09-17 NOTE — Progress Notes (Signed)
Subjective:    CC: Diabetes  HPI:  Diabetes - no hypoglycemic events. No wounds or sores that are not healing well. No increased thirst or urination. Checking glucose at home. Taking medications as prescribed without any side effects.  She has been doing well on the Victoza.  She has not lost as much weight as she was hoping for.  She says initially it seemed to help with her carb cravings but that seems to have come back some.  She has gone down a pant size though.  Follow-up social anxiety disorder-currently on paroxetine 40 mg daily and uses Xanax PRN.  She reports that she uses it pretty sparingly.  Last fill was in February 2019.  Acne - she was previously on doxy by her dermatologist. She thinks she might need a refill soon. Has been breaking out more.   Past medical history, Surgical history, Family history not pertinant except as noted below, Social history, Allergies, and medications have been entered into the medical record, reviewed, and corrections made.   Review of Systems: No fevers, chills, night sweats, weight loss, chest pain, or shortness of breath.   Objective:    General: Well Developed, well nourished, and in no acute distress.  Neuro: Alert and oriented x3, extra-ocular muscles intact, sensation grossly intact.  HEENT: Normocephalic, atraumatic  Skin: Warm and dry, no rashes. Cardiac: Regular rate and rhythm, no murmurs rubs or gallops, no lower extremity edema.  Respiratory: Clear to auscultation bilaterally. Not using accessory muscles, speaking in full sentences.   Impression and Recommendations:    DM -I am thrilled with her progress.  Last A1c was 7.0 she is actually down to 6.3.  She has lost a little bit of weight.  And has made some changes.  Still encouraged her to work on cutting out diet soda.  She still drinks 3 a day and I discussed with her that studies show that artificial stoker can still cause weight gain.  Really work on increasing water  intake.  Anxiety/social-we will refill paroxetine today.  She uses her Xanax sparingly.  She is doing well for now.  Acne -she can call us with the prescription dose and name and I will refill it.  I do not see it on our medication list so we have never prescribed it here.  Reminded to due her mammogram.    Reminded to do her cologuard.

## 2018-09-22 LAB — COLOGUARD: Cologuard: NEGATIVE

## 2018-10-05 ENCOUNTER — Telehealth: Payer: Self-pay | Admitting: Family Medicine

## 2018-10-05 NOTE — Telephone Encounter (Signed)
Call pt: Jennifer Gray is negative. Repeat colon cancer screening in 3 years.

## 2018-10-06 NOTE — Telephone Encounter (Signed)
Pt advised of results, verbalized understanding. No further questions.  

## 2018-10-21 ENCOUNTER — Encounter: Payer: Self-pay | Admitting: Family Medicine

## 2019-01-18 ENCOUNTER — Encounter: Payer: Self-pay | Admitting: Family Medicine

## 2019-01-18 ENCOUNTER — Ambulatory Visit (INDEPENDENT_AMBULATORY_CARE_PROVIDER_SITE_OTHER): Payer: BLUE CROSS/BLUE SHIELD | Admitting: Family Medicine

## 2019-01-18 VITALS — Ht 69.0 in | Wt 213.0 lb

## 2019-01-18 DIAGNOSIS — E119 Type 2 diabetes mellitus without complications: Secondary | ICD-10-CM | POA: Diagnosis not present

## 2019-01-18 DIAGNOSIS — L709 Acne, unspecified: Secondary | ICD-10-CM

## 2019-01-18 NOTE — Progress Notes (Signed)
Virtual Visit via Video Note  I connected with Jennifer Gray on 01/18/19 at  4:00 PM EDT by a video enabled telemedicine application and verified that I am speaking with the correct person using two identifiers.   I discussed the limitations of evaluation and management by telemedicine and the availability of in person appointments. The patient expressed understanding and agreed to proceed.  Pt was at home and I was in my office for the virtual visit.     Subjective:    CC: 4 mo f/u   HPI: Diabetes - no hypoglycemic events. No wounds or sores that are not healing well. No increased thirst or urination. Checking glucose at home. Taking medications as prescribed without any side effects. Hasn't had the bedt diet bc has been bored eating.  On Janumet and victoza 1.8 mg.    She recently moved and that has been exciting but she has been more bored recently   Has had more acne breakot on her chin lately. Not sure if stress related. Not treating it currently.     Past medical history, Surgical history, Family history not pertinant except as noted below, Social history, Allergies, and medications have been entered into the medical record, reviewed, and corrections made.   Review of Systems: No fevers, chills, night sweats, weight loss, chest pain, or shortness of breath.   Objective:    General: Speaking clearly in complete sentences without any shortness of breath.  Alert and oriented x3.  Normal judgment. No apparent acute distress. Well controlled.   Well groomed.    Impression and Recommendations:   DM - asyptomatic.  Diet has been off a little. Due for A1C and lipid screen. Labs ordered. She can go at her convience. Encouraged walking daily for 15 min. Encourage regular exercise.   Acne - can try a topical benzoyl peroxide. She was on doxy at one point. We can use again if needed. Avoid putting her hands on her face. Stress can trigger as well.      I discussed the assessment  and treatment plan with the patient. The patient was provided an opportunity to ask questions and all were answered. The patient agreed with the plan and demonstrated an understanding of the instructions.   The patient was advised to call back or seek an in-person evaluation if the symptoms worsen or if the condition fails to improve as anticipated.   Beatrice Lecher, MD

## 2019-01-21 ENCOUNTER — Ambulatory Visit: Payer: BLUE CROSS/BLUE SHIELD | Admitting: Family Medicine

## 2019-03-02 ENCOUNTER — Encounter: Payer: Self-pay | Admitting: Emergency Medicine

## 2019-03-02 ENCOUNTER — Other Ambulatory Visit: Payer: Self-pay

## 2019-03-02 ENCOUNTER — Emergency Department (INDEPENDENT_AMBULATORY_CARE_PROVIDER_SITE_OTHER)
Admission: EM | Admit: 2019-03-02 | Discharge: 2019-03-02 | Disposition: A | Discharge status: Nursing Home (Includes ICF) | Payer: BLUE CROSS/BLUE SHIELD | Source: Home / Self Care | Attending: Family Medicine | Admitting: Family Medicine

## 2019-03-02 DIAGNOSIS — R197 Diarrhea, unspecified: Secondary | ICD-10-CM

## 2019-03-02 DIAGNOSIS — R079 Chest pain, unspecified: Secondary | ICD-10-CM | POA: Diagnosis not present

## 2019-03-02 DIAGNOSIS — R110 Nausea: Secondary | ICD-10-CM

## 2019-03-02 DIAGNOSIS — M549 Dorsalgia, unspecified: Secondary | ICD-10-CM

## 2019-03-02 MED ORDER — ASPIRIN 325 MG PO TABS
325.0000 mg | ORAL_TABLET | Freq: Once | ORAL | Status: AC
  Administered 2019-03-02: 325 mg via ORAL

## 2019-03-02 MED ORDER — ONDANSETRON 4 MG PO TBDP
4.0000 mg | ORAL_TABLET | Freq: Once | ORAL | Status: AC
  Administered 2019-03-02: 4 mg via ORAL

## 2019-03-02 NOTE — Discharge Instructions (Signed)
°  You have declined EMS transport but it is still highly recommended you have your husband drive you to the closest emergency department for further evaluation and treatment of your symptoms.  They will be able to perform more tests to make sure that you are not having a heart attack and if they feel necessary, they will be able to order a Covid-19 test.

## 2019-03-02 NOTE — ED Provider Notes (Addendum)
[facility]    CSN: [id number] Arrival date & time: 03/02/19  1754     History   Chief Complaint Chief Complaint  Patient presents with  . Chest Pain  . Shoulder Pain    HPI Jennifer Gray is a 53 y.o. female.   HPI  Jennifer Gray is a 53 y.o. female presenting to UC with c/o intermittent substernal chest pain that is dull, radiating into her back. Associated nausea and diarrhea. Symptoms started yesterday. She does have a hx of anxiety so she took Xanax earlier today and slept until 4PM but pain came back after she woke up. She has felt short of breath at times too. She reports not being able to sleep last night but cannot recall any specific trigger to increased stress or anxiety. Denies prior hx of heart problems but notes her grandparents have had heart problems in the past.  Denies recent URI symptoms of cough, congestion, fever or chills. No sick contacts or recent travel.    Pt's BP is elevated. No hx of HTN. She is not on any HTN medication.  Past Medical History:  Diagnosis Date  . Anxiety   . Anxiety     Patient Active Problem List   Diagnosis Date Noted  . Acne 09/17/2018  . Controlled type 2 diabetes mellitus without complication, without long-term current use of insulin ([place]) 05/26/2016  . Chronic constipation 02/13/2015  . Social anxiety disorder 08/01/2013  . Radiculitis of left cervical region 09/30/2012    Past Surgical History:  Procedure Laterality Date  . BACK SURGERY    . BREAST SURGERY      OB History   No obstetric history on file.      Home Medications    Prior to Admission medications   Medication Sig Start Date End Date Taking? Authorizing Provider  ACCU-CHEK SOFTCLIX LANCETS lancets For testing once a day. Dx:E11.65 04/08/18   Hali Marry, MD  ALPRAZolam Duanne Moron) 0.5 MG tablet Take 1 tablet (0.5 mg total) by mouth as needed for anxiety or sleep. 10/22/17   Hali Marry, MD  AMBULATORY NON FORMULARY  MEDICATION Medication Name: glucometer, lancets and strips to test once a day. Dx. Diabetes type 2. 90 days supply 05/26/16   Hali Marry, MD  BD PEN NEEDLE NANO U/F 32G X 4 MM MISC USE AS DIRECTED DAILY 08/23/18   Hali Marry, MD  glucose blood (ACCU-CHEK AVIVA) test strip For testing once a day. Dx:E11.65 04/08/18   Hali Marry, MD  liraglutide (VICTOZA) 18 MG/3ML SOPN Inject 0.3 mLs (1.8 mg total) into the skin daily. 07/05/18   Hali Marry, MD  PARoxetine (PAXIL) 40 MG tablet TAKE 1 TABLET BY MOUTH ONCE DAILY. 09/17/18   Hali Marry, MD  SitaGLIPtin-MetFORMIN HCl (JANUMET XR) [phone] MG TB24 Take 1 tablet by mouth daily. 09/17/18   Hali Marry, MD    Family History Family History  Problem Relation Age of Onset  . Hypertension Mother   . Cancer Father   . Diabetes Other     Social History Social History   Tobacco Use  . Smoking status: Former Smoker    Years: 10.00    Quit date: 09/17/2006    Years since quitting: 12.4  . Smokeless tobacco: Never Used  Substance Use Topics  . Alcohol use: No  . Drug use: No     Allergies   Amoxicillin, Codeine, and Vicodin [hydrocodone-acetaminophen]   Review of Systems Review of Systems  Constitutional: Negative for appetite change, chills, diaphoresis and fever.  HENT: Negative for congestion.   Respiratory: Positive for chest tightness and shortness of breath. Negative for cough, wheezing and stridor.   Cardiovascular: Positive for chest pain. Negative for palpitations and leg swelling.  Gastrointestinal: Positive for abdominal pain (mild, generalized), diarrhea and nausea. Negative for vomiting.  Genitourinary: Negative for dysuria, flank pain, frequency and hematuria.  Musculoskeletal: Positive for back pain. Negative for arthralgias and myalgias.  Skin: Negative for rash.  Neurological: Negative for dizziness, light-headedness and headaches.     Physical Exam Triage Vital  Signs ED Triage Vitals  Enc Vitals Group     BP      Pulse      Resp      Temp      Temp src      SpO2      Weight      Height      Head Circumference      Peak Flow      Pain Score      Pain Loc      Pain Edu?      Excl. in [place]?    No data found.  Updated Vital Signs BP (!) 179/90 (BP Location: Right Arm)   Pulse (!) 107   Temp 98.4 F (36.9 C) (Oral)   Resp 18   Ht 5\' 9"  (1.753 m)   Wt 215 lb (97.5 kg)   LMP  (LMP Unknown)   SpO2 98%   BMI 31.75 kg/m   Visual Acuity Right Eye Distance:   Left Eye Distance:   Bilateral Distance:    Right Eye Near:   Left Eye Near:    Bilateral Near:     Physical Exam Vitals signs and nursing note reviewed.  Constitutional:      Appearance: She is well-developed. She is not ill-appearing, toxic-appearing or diaphoretic.     Comments: Appears mildly anxious, sitting on exam bed. Alert and oriented. Cooperative during exam.   HENT:     Head: Normocephalic and atraumatic.  Neck:     Musculoskeletal: Normal range of motion and neck supple.  Cardiovascular:     Rate and Rhythm: Regular rhythm. Tachycardia present.  Pulmonary:     Effort: Pulmonary effort is normal.     Breath sounds: No decreased breath sounds, wheezing, rhonchi or rales.  Chest:     Chest wall: No tenderness.  Abdominal:     Palpations: Abdomen is soft.     Tenderness: There is abdominal tenderness (mild across lower abdomen.). There is no right CVA tenderness, left CVA tenderness, guarding or rebound.  Musculoskeletal: Normal range of motion.     Thoracic back: She exhibits no tenderness.     Comments: No spinal or muscular tenderness reproducible on exam.  Skin:    General: Skin is warm and dry.     Capillary Refill: Capillary refill takes less than 2 seconds.  Neurological:     Mental Status: She is alert and oriented to person, place, and time.  Psychiatric:        Behavior: Behavior normal.      UC Treatments / Results  Labs (all labs  ordered are listed, but only abnormal results are displayed) Labs Reviewed - No data to display  EKG Date/Time:03/02/2019    18:13:08 Ventricular Rate: 109 PR Interval: 148 QRS Duration: 76 QT Interval: 336 QTC Calculation: 452 P-R-T axes: 14  61  40 Text Interpretation: Sinus tachycardia. Possible Left atrial  enlargement. Borderline ECG  No prior EKG to compare.    Radiology No results found.  Procedures Procedures (including critical care time)  Medications Ordered in UC Medications  ondansetron (ZOFRAN-ODT) disintegrating tablet 4 mg (4 mg Oral Given 03/02/19 1838)  aspirin tablet 325 mg (325 mg Oral Given 03/02/19 1838)    Initial Impression / Assessment and Plan / UC Course  I have reviewed the triage vital signs and the nursing notes.  Pertinent labs & imaging results that were available during my care of the patient were reviewed by me and considered in my medical decision making (see chart for details).    Description of pain concerning for possible ACS. EKG unremarkable.  Consulted with Dr. Assunta Found, supervising physician, agrees pt should be further evaluated in emergency department for possible ACS, covid or other emergent process taking place given pt's age and location of pain and other concurrent symptoms. Pt declined EMS transport. Pt's husband is in waiting room and able to transport her POV. Pt agrees to be taken to [facility] Emergency Department for further evaluation.   Final Clinical Impressions(s) / UC Diagnoses   Final diagnoses:  Chest pain, unspecified type  Mid back pain  Nausea without vomiting  Diarrhea, unspecified type     Discharge Instructions      You have declined EMS transport but it is still highly recommended you have your husband drive you to the closest emergency department for further evaluation and treatment of your symptoms.  They will be able to perform more tests to make sure that you are not having a heart  attack and if they feel necessary, they will be able to order a Covid-19 test.     ED Prescriptions    None     Controlled Substance Prescriptions [place] Controlled Substance Registry consulted? Not Applicable   Tyrell Antonio 03/02/19 1912    Noe Gens, PA-C 03/02/19 1914

## 2019-03-02 NOTE — ED Triage Notes (Signed)
Began having sub sternal intermittent pain yesterday; it radiates around both shoulders. Took xanax today thinking it was anxiety attack; slept most of day. Now husband is concerned and wants her evaluated. She has not travelled past 4 weeks.

## 2019-03-03 ENCOUNTER — Other Ambulatory Visit: Payer: Self-pay

## 2019-03-03 ENCOUNTER — Encounter: Payer: Self-pay | Admitting: Family Medicine

## 2019-03-03 ENCOUNTER — Ambulatory Visit (INDEPENDENT_AMBULATORY_CARE_PROVIDER_SITE_OTHER): Payer: BLUE CROSS/BLUE SHIELD | Admitting: Family Medicine

## 2019-03-03 ENCOUNTER — Other Ambulatory Visit: Payer: Self-pay | Admitting: Family Medicine

## 2019-03-03 VITALS — BP 153/78 | HR 98 | Temp 98.4°F | Ht 69.0 in | Wt 210.0 lb

## 2019-03-03 DIAGNOSIS — E119 Type 2 diabetes mellitus without complications: Secondary | ICD-10-CM | POA: Diagnosis not present

## 2019-03-03 DIAGNOSIS — R6889 Other general symptoms and signs: Secondary | ICD-10-CM

## 2019-03-03 DIAGNOSIS — R0602 Shortness of breath: Secondary | ICD-10-CM

## 2019-03-03 DIAGNOSIS — Z20822 Contact with and (suspected) exposure to covid-19: Secondary | ICD-10-CM

## 2019-03-03 DIAGNOSIS — R0789 Other chest pain: Secondary | ICD-10-CM | POA: Diagnosis not present

## 2019-03-03 LAB — POCT GLYCOSYLATED HEMOGLOBIN (HGB A1C): Hemoglobin A1C: 6.4 % — AB (ref 4.0–5.6)

## 2019-03-03 MED ORDER — LANSOPRAZOLE 15 MG PO CPDR: 15.0000 mg | DELAYED_RELEASE_CAPSULE | Freq: Every day | ORAL | 0 refills | Status: DC

## 2019-03-03 NOTE — Telephone Encounter (Signed)
PT requested refill on xanax.   Please advise.

## 2019-03-03 NOTE — Progress Notes (Signed)
Established Patient Office Visit  Subjective:  Patient ID: Jennifer Gray, female    DOB: 26-Oct-1965  Age: 53 y.o. MRN: [id number]  CC:  Chief Complaint  Patient presents with  . Chest Pain    HPI Mariha Sleeper presents for chest tightness that started about 2 days ago.  It is a little bit better than it was but still present.  She says it seems to be worse with exertion such as sitting up and walking.  Actually feels a little better when she lays flat on her back.  She denies any cough or sputum production.  But does feel short of breath.  Last night she had an episode where she felt extremely nauseated but did not vomit.  Her husband who is with her is concerned about heart disease as her brother had an MI at age 62.  She did go to the emergency department.  She had normal CK and troponin levels.  Negative d-dimer.  Normal CBC.  She denies any recent change or increase in heartburn.  She has had a history of heartburn though he does have medication at home but has not been using it.  Says the chest tightness is across the upper right and left side of the chest and goes all the way into her upper back as well.  Her husband feels like she has been little bit more short of breath for really about 2 months she is just noticed that he gets she gets more easily winded.  He also feels like occasionally something is poking her in the left upper side of her chest.  She is also had a little bit of epigastric discomfort.  She has felt sweaty and dizzy for the last several days.  She has had some nasal allergies with some postnasal drip but no sore throat or runny nose.  + fam hx of heart disease.    Past Medical History:  Diagnosis Date  . Anxiety   . Anxiety     Past Surgical History:  Procedure Laterality Date  . BACK SURGERY    . BREAST SURGERY      Family History  Problem Relation Age of Onset  . Hypertension Mother   . Cancer Father   . Diabetes Other   . Heart disease Brother  63    Social History   Socioeconomic History  . Marital status: Married    Spouse name: Not on file  . Number of children: Not on file  . Years of education: Not on file  . Highest education level: Not on file  Occupational History  . Not on file  Social Needs  . Financial resource strain: Not on file  . Food insecurity    Worry: Not on file    Inability: Not on file  . Transportation needs    Medical: Not on file    Non-medical: Not on file  Tobacco Use  . Smoking status: Former Smoker    Years: 10.00    Quit date: 09/17/2006    Years since quitting: 12.4  . Smokeless tobacco: Never Used  Substance and Sexual Activity  . Alcohol use: No  . Drug use: No  . Sexual activity: Not on file  Lifestyle  . Physical activity    Days per week: Not on file    Minutes per session: Not on file  . Stress: Not on file  Relationships  . Social connections    Talks on phone: Not on file  Gets together: Not on file    Attends religious service: Not on file    Active member of club or organization: Not on file    Attends meetings of clubs or organizations: Not on file    Relationship status: Not on file  . Intimate partner violence    Fear of current or ex partner: Not on file    Emotionally abused: Not on file    Physically abused: Not on file    Forced sexual activity: Not on file  Other Topics Concern  . Not on file  Social History Narrative  . Not on file    Outpatient Medications Prior to Visit  Medication Sig Dispense Refill  . ACCU-CHEK SOFTCLIX LANCETS lancets For testing once a day. Dx:E11.65 100 each 12  . ALPRAZolam (XANAX) 0.5 MG tablet Take 1 tablet (0.5 mg total) by mouth as needed for anxiety or sleep. 30 tablet 0  . AMBULATORY NON FORMULARY MEDICATION Medication Name: glucometer, lancets and strips to test once a day. Dx. Diabetes type 2. 90 days supply 1 Units PRN  . BD PEN NEEDLE NANO U/F 32G X 4 MM MISC USE AS DIRECTED DAILY 100 each 1  . glucose blood  (ACCU-CHEK AVIVA) test strip For testing once a day. Dx:E11.65 100 each 12  . liraglutide (VICTOZA) 18 MG/3ML SOPN Inject 0.3 mLs (1.8 mg total) into the skin daily. 18 mL 3  . PARoxetine (PAXIL) 40 MG tablet TAKE 1 TABLET BY MOUTH ONCE DAILY. 90 tablet 1  . SitaGLIPtin-MetFORMIN HCl (JANUMET XR) [phone] MG TB24 Take 1 tablet by mouth daily. 90 tablet 1   No facility-administered medications prior to visit.     Allergies  Allergen Reactions  . Amoxicillin Shortness Of Breath and Rash  . Codeine     headache  . Vicodin [Hydrocodone-Acetaminophen]     ROS Review of Systems    Objective:    Physical Exam  Constitutional: She is oriented to person, place, and time. She appears well-developed and well-nourished.  HENT:  Head: Normocephalic and atraumatic.  Right Ear: External ear normal.  Left Ear: External ear normal.  Nose: Nose normal.  Mouth/Throat: Oropharynx is clear and moist.  TMs and canals are clear.   Eyes: Pupils are equal, round, and reactive to light. Conjunctivae and EOM are normal.  Neck: Neck supple. No thyromegaly present.  Cardiovascular: Normal rate, regular rhythm and normal heart sounds.  Pulmonary/Chest: Effort normal and breath sounds normal. She has no wheezes.  Lymphadenopathy:    She has no cervical adenopathy.  Neurological: She is alert and oriented to person, place, and time.  Skin: Skin is warm and dry.  Psychiatric: She has a normal mood and affect.    BP (!) 153/78   Pulse 98   Temp 98.4 F (36.9 C) (Oral)   Ht 5\' 9"  (1.753 m)   Wt 210 lb (95.3 kg)   LMP  (LMP Unknown)   SpO2 100%   BMI 31.01 kg/m  Wt Readings from Last 3 Encounters:  03/03/19 210 lb (95.3 kg)  03/02/19 215 lb (97.5 kg)  01/18/19 213 lb (96.6 kg)     Health Maintenance Due  Topic Date Due  . URINE MICROALBUMIN  10/22/2018  . TETANUS/TDAP  01/16/2019    There are no preventive care reminders to display for this patient.  Lab Results  Component Value Date    TSH 3.80 03/08/2018   Lab Results  Component Value Date   WBC 7.1 03/08/2018  HGB 13.2 03/08/2018   HCT 39.3 03/08/2018   MCV 75.3 (L) 03/08/2018   PLT 422 (H) 03/08/2018   Lab Results  Component Value Date   NA 140 03/08/2018   K 4.5 03/08/2018   CO2 22 03/08/2018   GLUCOSE 160 (H) 03/08/2018   BUN 17 03/08/2018   CREATININE 0.95 03/08/2018   BILITOT 0.4 03/08/2018   ALKPHOS 70 10/22/2016   AST 17 03/08/2018   ALT 34 (H) 03/08/2018   PROT 6.9 03/08/2018   ALBUMIN 4.4 10/22/2016   CALCIUM 9.5 03/08/2018   Lab Results  Component Value Date   CHOL 230 (H) 03/08/2018   Lab Results  Component Value Date   HDL 43 (L) 03/08/2018   Lab Results  Component Value Date   LDLCALC 155 (H) 03/08/2018   Lab Results  Component Value Date   TRIG 188 (H) 03/08/2018   Lab Results  Component Value Date   CHOLHDL 5.3 (H) 03/08/2018   Lab Results  Component Value Date   HGBA1C 6.4 (A) 03/03/2019      Assessment & Plan:   Problem List Items Addressed This Visit      Endocrine   Controlled type 2 diabetes mellitus without complication, without long-term current use of insulin (HCC) - Primary   Relevant Orders   POCT glycosylated hemoglobin (Hb A1C) (Completed)    Other Visit Diagnoses    Chest tightness       Relevant Orders   Exercise Tolerance Test   ECHOCARDIOGRAM COMPLETE   SOB (shortness of breath)       Relevant Orders   Exercise Tolerance Test   ECHOCARDIOGRAM COMPLETE   Suspected Covid-19 Virus Infection         Chest tightness with shortness of breath-unclear etiology.  She has been ruled out for acute MI as well as pulmonary embolism.  Number to put her on a PPI through the weekend that she does have a history of GERD and had an episode of severe nausea last night.  We will also consider getting her tested for COVID.  We will also get her scheduled for a cardiac stress test that she does have a strong family history of heart disease.   Meds ordered  this encounter  Medications  . lansoprazole (PREVACID) 15 MG capsule    Sig: Take 1 capsule (15 mg total) by mouth at bedtime.    Dispense:  30 capsule    Refill:  0    Follow-up: Return in about 1 week (around 03/10/2019) for SOB.    Beatrice Lecher, MD

## 2019-03-03 NOTE — Patient Instructions (Signed)
Okay to try Aleve or ibuprofen as needed.

## 2019-03-04 ENCOUNTER — Telehealth: Payer: Self-pay | Admitting: *Deleted

## 2019-03-04 DIAGNOSIS — Z20822 Contact with and (suspected) exposure to covid-19: Secondary | ICD-10-CM

## 2019-03-04 NOTE — Telephone Encounter (Addendum)
Contacted pt to schedule COVID testing; pt accepted appointment at [facility] site 03/07/2019 at 1030; pt given address, location, and instructions that she and all occupants of her vehicle should wear masks; she verbalized understanding; orders placed per protocol. ----- Message from Hali Marry, MD sent at 03/03/2019  3:38 PM EDT ----- Patient with chest tightness and shortness of breath.  Already ruled out for heart attack and d-dimer.  Recommend testing for COVID. Please  Schedule

## 2019-03-06 NOTE — Addendum Note (Signed)
Addended by: Teddy Spike on: 03/06/2019 03:14 PM   Modules accepted: Orders

## 2019-03-07 ENCOUNTER — Other Ambulatory Visit: Payer: BLUE CROSS/BLUE SHIELD

## 2019-03-07 DIAGNOSIS — Z20822 Contact with and (suspected) exposure to covid-19: Secondary | ICD-10-CM

## 2019-03-07 MED ORDER — ALPRAZOLAM 0.5 MG PO TABS: 0.5000 mg | ORAL_TABLET | ORAL | 1 refills | Status: DC | PRN

## 2019-03-08 ENCOUNTER — Inpatient Hospital Stay: Payer: BLUE CROSS/BLUE SHIELD | Admitting: Family Medicine

## 2019-03-08 ENCOUNTER — Telehealth: Payer: Self-pay | Admitting: Family Medicine

## 2019-03-08 NOTE — Telephone Encounter (Signed)
Patient wants to know if she needs to keep her appointment for 03/10/2019 or if she should wait for results to come back. Please advise.

## 2019-03-08 NOTE — Telephone Encounter (Signed)
Appointment has been made. No further questions at this time.  

## 2019-03-08 NOTE — Telephone Encounter (Signed)
Please schedule pt

## 2019-03-08 NOTE — Telephone Encounter (Signed)
If she is doing okay let us push it out to early next week just in case.  If the results still are not back then she will be allowed to come in because of the quarantine.  So it may just be worth pushing it out to be on the safe side.

## 2019-03-10 ENCOUNTER — Ambulatory Visit: Payer: BLUE CROSS/BLUE SHIELD | Admitting: Family Medicine

## 2019-03-12 LAB — NOVEL CORONAVIRUS, NAA: SARS-CoV-2, NAA: NOT DETECTED

## 2019-03-16 ENCOUNTER — Telehealth: Payer: Self-pay

## 2019-03-16 NOTE — Telephone Encounter (Signed)
REFERRAL ONLY, AND REFERRAL SENT TO SCHEDULING

## 2019-03-17 ENCOUNTER — Ambulatory Visit (HOSPITAL_COMMUNITY)
Admission: RE | Admit: 2019-03-17 | Discharge: 2019-03-17 | Disposition: A | Discharge status: Home / Self Care | Payer: BLUE CROSS/BLUE SHIELD | Source: Ambulatory Visit | Attending: Family Medicine | Admitting: Family Medicine

## 2019-03-17 ENCOUNTER — Other Ambulatory Visit: Payer: Self-pay

## 2019-03-17 DIAGNOSIS — R0602 Shortness of breath: Secondary | ICD-10-CM | POA: Insufficient documentation

## 2019-03-17 DIAGNOSIS — F419 Anxiety disorder, unspecified: Secondary | ICD-10-CM | POA: Diagnosis not present

## 2019-03-17 DIAGNOSIS — R0789 Other chest pain: Secondary | ICD-10-CM | POA: Insufficient documentation

## 2019-03-17 DIAGNOSIS — I313 Pericardial effusion (noninflammatory): Secondary | ICD-10-CM | POA: Insufficient documentation

## 2019-03-17 DIAGNOSIS — E119 Type 2 diabetes mellitus without complications: Secondary | ICD-10-CM | POA: Insufficient documentation

## 2019-03-17 NOTE — Progress Notes (Signed)
  Echocardiogram 2D Echocardiogram has been performed.  Jennifer Gray 03/17/2019, 3:41 PM

## 2019-03-18 ENCOUNTER — Ambulatory Visit: Payer: BLUE CROSS/BLUE SHIELD | Admitting: Family Medicine

## 2019-03-18 NOTE — Progress Notes (Deleted)
Established Patient Office Visit  Subjective:  Patient ID: Jennifer Gray, female    DOB: [birth year]  Age: 53 y.o. MRN: [id number]  CC: No chief complaint on file.   HPI Jennifer Gray presents for SOB.   Previous note: "Jennifer Gray presents for chest tightness that started about 2 days ago.  It is a little bit better than it was but still present.  She says it seems to be worse with exertion such as sitting up and walking.  Actually feels a little better when she lays flat on her back.  She denies any cough or sputum production.  But does feel short of breath.  Last night she had an episode where she felt extremely nauseated but did not vomit.  Her husband who is with her is concerned about heart disease as her brother had an MI at age 76.  She did go to the emergency department.  She had normal CK and troponin levels.  Negative d-dimer.  Normal CBC.  She denies any recent change or increase in heartburn.  She has had a history of heartburn though he does have medication at home but has not been using it.  Says the chest tightness is across the upper right and left side of the chest and goes all the way into her upper back as well.  Her husband feels like she has been little bit more short of breath for really about 2 months she is just noticed that he gets she gets more easily winded.  He also feels like occasionally something is poking her in the left upper side of her chest.  She is also had a little bit of epigastric discomfort.  She has felt sweaty and dizzy for the last several days.  She has had some nasal allergies with some postnasal drip but no sore throat or runny nose."  Past Medical History:  Diagnosis Date  . Anxiety   . Anxiety     Past Surgical History:  Procedure Laterality Date  . BACK SURGERY    . BREAST SURGERY      Family History  Problem Relation Age of Onset  . Hypertension Mother   . Cancer Father   . Diabetes Other   . Heart disease Brother 49     Social History   Socioeconomic History  . Marital status: Married    Spouse name: Not on file  . Number of children: Not on file  . Years of education: Not on file  . Highest education level: Not on file  Occupational History  . Not on file  Social Needs  . Financial resource strain: Not on file  . Food insecurity    Worry: Not on file    Inability: Not on file  . Transportation needs    Medical: Not on file    Non-medical: Not on file  Tobacco Use  . Smoking status: Former Smoker    Years: 10.00    Quit date: 09/17/2006    Years since quitting: 12.5  . Smokeless tobacco: Never Used  Substance and Sexual Activity  . Alcohol use: No  . Drug use: No  . Sexual activity: Not on file  Lifestyle  . Physical activity    Days per week: Not on file    Minutes per session: Not on file  . Stress: Not on file  Relationships  . Social Herbalist on phone: Not on file    Gets together: Not on file  Attends religious service: Not on file    Active member of club or organization: Not on file    Attends meetings of clubs or organizations: Not on file    Relationship status: Not on file  . Intimate partner violence    Fear of current or ex partner: Not on file    Emotionally abused: Not on file    Physically abused: Not on file    Forced sexual activity: Not on file  Other Topics Concern  . Not on file  Social History Narrative  . Not on file    Outpatient Medications Prior to Visit  Medication Sig Dispense Refill  . ACCU-CHEK SOFTCLIX LANCETS lancets For testing once a day. Dx:E11.65 100 each 12  . ALPRAZolam (XANAX) 0.5 MG tablet Take 1 tablet (0.5 mg total) by mouth as needed for anxiety or sleep. 30 tablet 1  . AMBULATORY NON FORMULARY MEDICATION Medication Name: glucometer, lancets and strips to test once a day. Dx. Diabetes type 2. 90 days supply 1 Units PRN  . BD PEN NEEDLE NANO U/F 32G X 4 MM MISC USE AS DIRECTED DAILY 100 each 1  . glucose blood  (ACCU-CHEK AVIVA) test strip For testing once a day. Dx:E11.65 100 each 12  . lansoprazole (PREVACID) 15 MG capsule Take 1 capsule (15 mg total) by mouth at bedtime. 30 capsule 0  . liraglutide (VICTOZA) 18 MG/3ML SOPN Inject 0.3 mLs (1.8 mg total) into the skin daily. 18 mL 3  . PARoxetine (PAXIL) 40 MG tablet TAKE 1 TABLET BY MOUTH ONCE DAILY. 90 tablet 1  . SitaGLIPtin-MetFORMIN HCl (JANUMET XR) [phone] MG TB24 Take 1 tablet by mouth daily. 90 tablet 1   No facility-administered medications prior to visit.     Allergies  Allergen Reactions  . Amoxicillin Shortness Of Breath and Rash  . Codeine     headache  . Vicodin [Hydrocodone-Acetaminophen]     ROS Review of Systems    Objective:    Physical Exam  LMP  (LMP Unknown)  Wt Readings from Last 3 Encounters:  03/03/19 210 lb (95.3 kg)  03/02/19 215 lb (97.5 kg)  01/18/19 213 lb (96.6 kg)     Health Maintenance Due  Topic Date Due  . URINE MICROALBUMIN  10/22/2018  . TETANUS/TDAP  01/16/2019    There are no preventive care reminders to display for this patient.  Lab Results  Component Value Date   TSH 3.80 03/08/2018   Lab Results  Component Value Date   WBC 7.1 03/08/2018   HGB 13.2 03/08/2018   HCT 39.3 03/08/2018   MCV 75.3 (L) 03/08/2018   PLT 422 (H) 03/08/2018   Lab Results  Component Value Date   NA 140 03/08/2018   K 4.5 03/08/2018   CO2 22 03/08/2018   GLUCOSE 160 (H) 03/08/2018   BUN 17 03/08/2018   CREATININE 0.95 03/08/2018   BILITOT 0.4 03/08/2018   ALKPHOS 70 10/22/2016   AST 17 03/08/2018   ALT 34 (H) 03/08/2018   PROT 6.9 03/08/2018   ALBUMIN 4.4 10/22/2016   CALCIUM 9.5 03/08/2018   Lab Results  Component Value Date   CHOL 230 (H) 03/08/2018   Lab Results  Component Value Date   HDL 43 (L) 03/08/2018   Lab Results  Component Value Date   LDLCALC 155 (H) 03/08/2018   Lab Results  Component Value Date   TRIG 188 (H) 03/08/2018   Lab Results  Component Value Date    CHOLHDL 5.3 (H)  03/08/2018   Lab Results  Component Value Date   HGBA1C 6.4 (A) 03/03/2019      Assessment & Plan:   Problem List Items Addressed This Visit    None    Visit Diagnoses    SOB (shortness of breath)    -  Primary   Chest tightness          No orders of the defined types were placed in this encounter.   Follow-up: No follow-ups on file.    Beatrice Lecher, MD

## 2019-03-21 ENCOUNTER — Other Ambulatory Visit: Payer: Self-pay | Admitting: *Deleted

## 2019-03-21 DIAGNOSIS — R0789 Other chest pain: Secondary | ICD-10-CM

## 2019-03-21 DIAGNOSIS — R0602 Shortness of breath: Secondary | ICD-10-CM

## 2019-03-21 MED ORDER — VICTOZA 18 MG/3ML ~~LOC~~ SOPN: 1.8000 mg | PEN_INJECTOR | Freq: Every day | SUBCUTANEOUS | 3 refills | Status: DC

## 2019-03-24 ENCOUNTER — Other Ambulatory Visit (HOSPITAL_BASED_OUTPATIENT_CLINIC_OR_DEPARTMENT_OTHER): Payer: BLUE CROSS/BLUE SHIELD

## 2019-03-29 ENCOUNTER — Telehealth: Payer: Self-pay | Admitting: Internal Medicine

## 2019-03-29 NOTE — Telephone Encounter (Signed)

## 2019-03-29 NOTE — Telephone Encounter (Signed)
LVM, asking pt to call back to be pre-screened for COVID-19. °

## 2019-03-29 NOTE — Telephone Encounter (Signed)
New Message:    Pt says her husband have to go back with her for her appointment. She says she have anxiety attacks.

## 2019-03-29 NOTE — Telephone Encounter (Signed)
Spoke with patient and she stated she does not process things well on top of the anxiety. She always has with her.  Advised ok to have husband for the visit.

## 2019-03-30 ENCOUNTER — Encounter: Payer: Self-pay | Admitting: Internal Medicine

## 2019-03-30 ENCOUNTER — Ambulatory Visit: Payer: BLUE CROSS/BLUE SHIELD | Admitting: Internal Medicine

## 2019-03-30 ENCOUNTER — Other Ambulatory Visit: Payer: Self-pay | Admitting: Internal Medicine

## 2019-03-30 ENCOUNTER — Other Ambulatory Visit: Payer: Self-pay

## 2019-03-30 ENCOUNTER — Telehealth: Payer: Self-pay | Admitting: *Deleted

## 2019-03-30 VITALS — BP 153/93 | HR 105 | Temp 98.4°F | Ht 69.0 in | Wt 214.0 lb

## 2019-03-30 DIAGNOSIS — I313 Pericardial effusion (noninflammatory): Secondary | ICD-10-CM

## 2019-03-30 DIAGNOSIS — G4730 Sleep apnea, unspecified: Secondary | ICD-10-CM

## 2019-03-30 DIAGNOSIS — R079 Chest pain, unspecified: Secondary | ICD-10-CM | POA: Diagnosis not present

## 2019-03-30 DIAGNOSIS — I3139 Other pericardial effusion (noninflammatory): Secondary | ICD-10-CM

## 2019-03-30 DIAGNOSIS — R0600 Dyspnea, unspecified: Secondary | ICD-10-CM

## 2019-03-30 DIAGNOSIS — Z01818 Encounter for other preprocedural examination: Secondary | ICD-10-CM

## 2019-03-30 DIAGNOSIS — G4733 Obstructive sleep apnea (adult) (pediatric): Secondary | ICD-10-CM

## 2019-03-30 DIAGNOSIS — R0609 Other forms of dyspnea: Secondary | ICD-10-CM

## 2019-03-30 DIAGNOSIS — E119 Type 2 diabetes mellitus without complications: Secondary | ICD-10-CM

## 2019-03-30 DIAGNOSIS — R0602 Shortness of breath: Secondary | ICD-10-CM

## 2019-03-30 MED ORDER — METOPROLOL TARTRATE 50 MG PO TABS: 100.0000 mg | ORAL_TABLET | Freq: Once | ORAL | 0 refills | Status: DC

## 2019-03-30 NOTE — Telephone Encounter (Signed)
Patient notified of home sleep study appointment.

## 2019-03-30 NOTE — Progress Notes (Signed)
Cardiology Office Note:    Date:  03/30/2019   ID:  Jennifer Gray, DOB 04/23/1966, MRN [id number]  PCP:  Hali Marry, MD  Cardiologist:  No primary care provider on file.  Electrophysiologist:  None   Referring MD: Hali Marry, *   Chief Complaint: DOE  History of Present Illness:    Jennifer Gray is a 53 y.o. female with a hx of anxiety who presents for DOE, CP and palpitations. Went to the ED in early July with chest tightness. Over the weekend she had hours of chest pain and left arm squeezing. Her PCP has arranged for an ETT and echo.   She has had DOE chasing her grand kids and has chest discomfort following. Does not occur with her palpitations. She can complete housework, but has to stop after 10 minutes.   Palpitations feel different than anxiety in the past. 1x a month.   fhx - pgm and mgm MI, mother has multivessel CAD without stents or bypass. No scd  Social hx:  T: 1 pack a week. 20 years.  etoh rare.  No drugs or herbal supplements.  dm2- on oral therapy. hba1c 6.3 last.   The patient denies PND, orthopnea, or leg swelling. Denies syncope or presyncope. Denies dizziness or lightheadedness. Endorses snoring and has been evaluated for sleep apnea, does not use CPAP.   Past Medical History:  Diagnosis Date  . Anxiety   . Anxiety     Past Surgical History:  Procedure Laterality Date  . BACK SURGERY    . BREAST SURGERY      Current Medications: Current Meds  Medication Sig  . ACCU-CHEK SOFTCLIX LANCETS lancets For testing once a day. Dx:E11.65  . ALPRAZolam (XANAX) 0.5 MG tablet Take 1 tablet (0.5 mg total) by mouth as needed for anxiety or sleep.  . AMBULATORY NON FORMULARY MEDICATION Medication Name: glucometer, lancets and strips to test once a day. Dx. Diabetes type 2. 90 days supply  . BD PEN NEEDLE NANO U/F 32G X 4 MM MISC USE AS DIRECTED DAILY  . glucose blood (ACCU-CHEK AVIVA) test strip For testing once a day. Dx:E11.65   . lansoprazole (PREVACID) 15 MG capsule Take 1 capsule (15 mg total) by mouth at bedtime.  . liraglutide (VICTOZA) 18 MG/3ML SOPN Inject 0.3 mLs (1.8 mg total) into the skin daily.  Marland Kitchen PARoxetine (PAXIL) 40 MG tablet TAKE 1 TABLET BY MOUTH ONCE DAILY.  Marland Kitchen SitaGLIPtin-MetFORMIN HCl (JANUMET XR) [phone] MG TB24 Take 1 tablet by mouth daily.     Allergies:   Amoxicillin, Codeine, and Vicodin [hydrocodone-acetaminophen]   Social History   Socioeconomic History  . Marital status: Married    Spouse name: Not on file  . Number of children: Not on file  . Years of education: Not on file  . Highest education level: Not on file  Occupational History  . Not on file  Social Needs  . Financial resource strain: Not on file  . Food insecurity    Worry: Not on file    Inability: Not on file  . Transportation needs    Medical: Not on file    Non-medical: Not on file  Tobacco Use  . Smoking status: Former Smoker    Years: 10.00    Quit date: 09/17/2006    Years since quitting: 12.5  . Smokeless tobacco: Never Used  Substance and Sexual Activity  . Alcohol use: No  . Drug use: No  . Sexual activity: Not on file  Lifestyle  . Physical activity    Days per week: Not on file    Minutes per session: Not on file  . Stress: Not on file  Relationships  . Social Herbalist on phone: Not on file    Gets together: Not on file    Attends religious service: Not on file    Active member of club or organization: Not on file    Attends meetings of clubs or organizations: Not on file    Relationship status: Not on file  Other Topics Concern  . Not on file  Social History Narrative  . Not on file     Family History: The patient's family history includes Cancer in her father; Diabetes in an other family member; Heart disease (age of onset: 92) in her brother; Hypertension in her mother.  ROS:   Please see the history of present illness.    All other systems reviewed and are negative.   EKGs/Labs/Other Studies Reviewed:    The following studies were reviewed today:  EKG:  NSR, poor R wave progression anteroseptal leads  Recent Labs: No results found for requested labs within last 8760 hours.  Recent Lipid Panel    Component Value Date/Time   CHOL 230 (H) 03/08/2018 0804   TRIG 188 (H) 03/08/2018 0804   HDL 43 (L) 03/08/2018 0804   CHOLHDL 5.3 (H) 03/08/2018 0804   VLDL 35 (H) 10/22/2016 0908   LDLCALC 155 (H) 03/08/2018 0804    Physical Exam:    VS:  BP (!) 153/93   Pulse (!) 105   Temp 98.4 F (36.9 C)   Ht _0  (1.753 m)   Wt 214 lb (97.1 kg)   LMP  (LMP Unknown)   SpO2 98%   BMI 31.60 kg/m     Wt Readings from Last 5 Encounters:  03/30/19 214 lb (97.1 kg)  03/03/19 210 lb (95.3 kg)  03/02/19 215 lb (97.5 kg)  01/18/19 213 lb (96.6 kg)  09/17/18 210 lb (95.3 kg)     Constitutional: No acute distress Eyes: sclera non-icteric, normal conjunctiva and lids ENMT: normal dentition, moist mucous membranes Cardiovascular: regular rhythm, normal rate, no murmurs. S1 and S2 normal. Radial pulses normal bilaterally. No jugular venous distention.  Respiratory: clear to auscultation bilaterally GI : normal bowel sounds, soft and nontender. No distention.   MSK: extremities warm, well perfused. No edema.  NEURO: grossly nonfocal exam, moves all extremities. PSYCH: alert and oriented x 3, normal mood and affect.   ASSESSMENT:    1. Dyspnea on exertion   2. Sleep apnea, unspecified type   3. Pericardial effusion   4. Chest pain, unspecified type   5. Pre-op testing   6. Controlled type 2 diabetes mellitus without complication, without long-term current use of insulin ([place])    PLAN:    DOE - her PCP has arranged for an ETT for chest pain and DOE. This will be important to complete for quantitation of symptoms. Since we are unable to complete an exercise stress test due to COVID 19 practice restrictions, we will need imaging in addition to exclude  ischemia as well given some baseline changes on ECG that may impact the evaluation of ischemia. We will plan for CCTA for chest pain. She has diabetes so CCTA will be better to exclude obstructive CAD vs myoview. If her studies are negative for ischemia, consider addition of low dose lasix to evaluate if she has symptoms from diastolic dysfunction.  OSA - she has known OSA and has not been using CPAP, she will need to be referred for a sleep study.   HLD - needs repeat lipids, elevated last year.   Pericardial effusion - we will get an ESR and CRP to see if she has active inflammation contributing to symptoms.   TIME SPENT WITH PATIENT: 45 minutes of patient care. More than 50% of that time was spent on face to face coordination of care and counseling regarding evaluation of CAD, risk factors.  Cherlynn Kaiser, MD [place]  [facility]   Medication Adjustments/Labs and Tests Ordered: Current medicines are reviewed at length with the patient today.  Concerns regarding medicines are outlined above.  No orders of the defined types were placed in this encounter.  No orders of the defined types were placed in this encounter.   There are no Patient Instructions on file for this visit.

## 2019-03-30 NOTE — Telephone Encounter (Signed)
-----   Message from Benancio Deeds sent at 03/30/2019 10:58 AM EDT ----- Regarding: FW: sleep study  ----- Message ----- From: Shirl Harris I Sent: 03/30/2019  10:26 AM EDT To: Benancio Deeds, [facility] Sleep Studies Subject: sleep study                                      This patient needs a sleep study.

## 2019-03-30 NOTE — Patient Instructions (Addendum)
Medication Instructions:   NO CHANGES     Lab work: DO FASTING   LIPID CRP  SED RATE  SEPARATE TEST BMP - ONE WEEK PRIOR TO  SCHEDULECCTA     Testing/Procedures: WILL BE SCHEDULE AT [place] Your physician has requested that you have cardiac CTA. Cardiac computed tomography (CT) is a painless test that uses an x-ray machine to take clear, detailed pictures of your heart. For further information please visit [email or web address]. Please follow instruction sheet as given. AND  WILL BE  SCHEDULE AT [place]  ON [address] [place] WILL OBTAIN PRIOR AUTHORIZATION  PRIOR TO [facility] Your physician has recommended that you have a sleep study. This test records several body functions during sleep, including: brain activity, eye movement, oxygen and carbon dioxide blood levels, heart rate and rhythm, breathing rate and rhythm, the flow of air through your mouth and nose, snoring, body muscle movements, and chest and belly movement.      Follow-Up: At [facility], you and your health needs are our priority.  As part of our continuing mission to provide you with exceptional heart care, we have created designated Provider Care Teams.  These Care Teams include your primary Cardiologist (physician) and Advanced Practice Providers (APPs -  Physician Assistants and Nurse Practitioners) who all work together to provide you with the care you need, when you need it. . You will need a follow up appointment in  2  months.  Jory Sims, DNP, ANP  Any Other Special Instructions Will Be Listed Below (If Applicable).    INSTRUCTIONS FOR  CORONARY CTA    Please arrive at the [facility] main entrance of [facility] at (30-45 minutes prior to test start time)  [facility] [address] [place], [place] [address] [phone]  Proceed to the [facility] Radiology Department (First Floor).  Please follow these instructions carefully (unless  otherwise directed):  PLEASE HAVE LABS - BMP  AT LEAST ONE WEEK PRIOR TO TEST  On the Night Before the Test: . Drink plenty of water. . Do not consume any caffeinated/decaffeinated beverages or chocolate 12 hours prior to your test. . Do not take any antihistamines 12 hours prior to your test.    On the Day of the Test: . Drink plenty of water. Do not drink any water within one hour of the test. . Do not eat any food 4 hours prior to the test. . You may take your regular medications prior to the test. .  Take 100 mg of lopressor (metoprolol) one hour before the test.   After the Test: . Drink plenty of water. . After receiving IV contrast, you may experience a mild flushed feeling. This is normal. . On occasion, you may experience a mild rash up to 24 hours after the test. This is not dangerous. If this occurs, you can take Benadryl 25 mg and increase your fluid intake. . If you experience trouble breathing, this can be serious. If it is severe call 911 IMMEDIATELY. If it is mild, please call our office.

## 2019-03-31 LAB — SEDIMENTATION RATE: Sed Rate: 23 mm/hr (ref 0–40)

## 2019-03-31 LAB — C-REACTIVE PROTEIN: CRP: 4 mg/L (ref 0–10)

## 2019-04-01 ENCOUNTER — Other Ambulatory Visit: Payer: Self-pay | Admitting: *Deleted

## 2019-04-04 ENCOUNTER — Other Ambulatory Visit (HOSPITAL_COMMUNITY)
Admission: RE | Admit: 2019-04-04 | Discharge: 2019-04-04 | Disposition: A | Discharge status: Home / Self Care | Payer: BLUE CROSS/BLUE SHIELD | Source: Ambulatory Visit | Attending: Internal Medicine | Admitting: Internal Medicine

## 2019-04-04 DIAGNOSIS — Z20828 Contact with and (suspected) exposure to other viral communicable diseases: Secondary | ICD-10-CM | POA: Insufficient documentation

## 2019-04-04 DIAGNOSIS — Z01812 Encounter for preprocedural laboratory examination: Secondary | ICD-10-CM | POA: Insufficient documentation

## 2019-04-04 LAB — SARS CORONAVIRUS 2 (TAT 6-24 HRS): SARS Coronavirus 2: NEGATIVE

## 2019-04-05 ENCOUNTER — Telehealth (HOSPITAL_COMMUNITY): Payer: Self-pay | Admitting: *Deleted

## 2019-04-05 NOTE — Telephone Encounter (Signed)
Close encounter 

## 2019-04-07 ENCOUNTER — Other Ambulatory Visit: Payer: Self-pay

## 2019-04-07 ENCOUNTER — Ambulatory Visit (HOSPITAL_COMMUNITY)
Admission: RE | Admit: 2019-04-07 | Discharge: 2019-04-07 | Disposition: A | Discharge status: Home / Self Care | Payer: BLUE CROSS/BLUE SHIELD | Source: Ambulatory Visit | Attending: Cardiology | Admitting: Cardiology

## 2019-04-07 DIAGNOSIS — R0789 Other chest pain: Secondary | ICD-10-CM | POA: Insufficient documentation

## 2019-04-07 DIAGNOSIS — R0602 Shortness of breath: Secondary | ICD-10-CM | POA: Diagnosis present

## 2019-04-07 LAB — EXERCISE TOLERANCE TEST
Estimated workload: 7 METS
Exercise duration (min): 6 min
Exercise duration (sec): 0 s
MPHR: 166 {beats}/min
Peak HR: 150 {beats}/min
Percent HR: 90 %
RPE: 17
Rest HR: 103 {beats}/min

## 2019-04-10 ENCOUNTER — Other Ambulatory Visit: Payer: Self-pay | Admitting: Family Medicine

## 2019-04-29 ENCOUNTER — Telehealth (HOSPITAL_COMMUNITY): Payer: Self-pay | Admitting: Emergency Medicine

## 2019-04-29 NOTE — Telephone Encounter (Signed)
Reaching out to patient to offer assistance regarding upcoming cardiac imaging study; pt verbalizes understanding of appt date/time, parking situation and where to check in, pre-test NPO status and medications ordered, and verified current allergies; name and call back number provided for further questions should they arise Yolonda Purtle RN Navigator Cardiac Imaging [facility] [phone] office [phone] cell 

## 2019-05-02 ENCOUNTER — Ambulatory Visit (HOSPITAL_COMMUNITY)
Admission: RE | Admit: 2019-05-02 | Discharge: 2019-05-02 | Disposition: A | Discharge status: Home / Self Care | Payer: BLUE CROSS/BLUE SHIELD | Source: Ambulatory Visit | Attending: Internal Medicine | Admitting: Internal Medicine

## 2019-05-02 ENCOUNTER — Ambulatory Visit (HOSPITAL_COMMUNITY): Payer: BLUE CROSS/BLUE SHIELD

## 2019-05-02 ENCOUNTER — Other Ambulatory Visit: Payer: Self-pay

## 2019-05-02 DIAGNOSIS — R0609 Other forms of dyspnea: Secondary | ICD-10-CM

## 2019-05-02 DIAGNOSIS — R0600 Dyspnea, unspecified: Secondary | ICD-10-CM

## 2019-05-02 DIAGNOSIS — R079 Chest pain, unspecified: Secondary | ICD-10-CM

## 2019-05-02 LAB — POCT I-STAT CREATININE: Creatinine, Ser: 0.9 mg/dL (ref 0.44–1.00)

## 2019-05-02 MED ORDER — DILTIAZEM HCL 25 MG/5ML IV SOLN
15.0000 mg | Freq: Once | INTRAVENOUS | Status: AC
  Administered 2019-05-02: 15 mg via INTRAVENOUS
  Filled 2019-05-02: qty 5

## 2019-05-02 MED ORDER — DILTIAZEM HCL 25 MG/5ML IV SOLN
INTRAVENOUS | Status: AC
  Filled 2019-05-02: qty 5

## 2019-05-02 MED ORDER — METOPROLOL TARTRATE 5 MG/5ML IV SOLN
5.0000 mg | INTRAVENOUS | Status: DC | PRN
  Administered 2019-05-02 (×3): 5 mg via INTRAVENOUS
  Filled 2019-05-02 (×4): qty 5

## 2019-05-02 MED ORDER — IOHEXOL 350 MG/ML SOLN
80.0000 mL | Freq: Once | INTRAVENOUS | Status: AC | PRN
  Administered 2019-05-02: 80 mL via INTRAVENOUS

## 2019-05-02 MED ORDER — METOPROLOL TARTRATE 5 MG/5ML IV SOLN
INTRAVENOUS | Status: AC
  Administered 2019-05-02: 13:00:00 5 mg
  Filled 2019-05-02: qty 20

## 2019-05-02 MED ORDER — NITROGLYCERIN 0.4 MG SL SUBL
SUBLINGUAL_TABLET | SUBLINGUAL | Status: AC
  Filled 2019-05-02: qty 2

## 2019-05-02 MED ORDER — NITROGLYCERIN 0.4 MG SL SUBL
0.8000 mg | SUBLINGUAL_TABLET | SUBLINGUAL | Status: DC | PRN
  Administered 2019-05-02: 0.8 mg via SUBLINGUAL
  Filled 2019-05-02 (×2): qty 25

## 2019-05-04 ENCOUNTER — Encounter: Payer: Self-pay | Admitting: Family Medicine

## 2019-05-04 DIAGNOSIS — K760 Fatty (change of) liver, not elsewhere classified: Secondary | ICD-10-CM | POA: Insufficient documentation

## 2019-05-22 ENCOUNTER — Other Ambulatory Visit: Payer: Self-pay | Admitting: Family Medicine

## 2019-05-22 NOTE — Progress Notes (Signed)
Virtual Visit via Telephone Note   This visit type was conducted due to national recommendations for restrictions regarding the COVID-19 Pandemic (e.g. social distancing) in an effort to limit this patient's exposure and mitigate transmission in our community.  Due to her co-morbid illnesses, this patient is at least at moderate risk for complications without adequate follow up.  This format is felt to be most appropriate for this patient at this time.  The patient did not have access to video technology/had technical difficulties with video requiring transitioning to audio format only (telephone).  All issues noted in this document were discussed and addressed.  No physical exam could be performed with this format.  Please refer to the patient's chart for her  consent to telehealth for [facility].   Date:  05/23/2019   ID:  Jennifer Gray, DOB 1965/09/15, MRN [id number]  Patient Location: Home Provider Location: Home  PCP:  Hali Marry, MD  Cardiologist:  Dr.Acharya  Electrophysiologist:  None   Evaluation Performed:  Follow-Up Visit  Chief Complaint: Follow Up  History of Present Illness:    Jennifer Gray is a 53 y.o. female we are following for ongoing assessment and management of palpitations, chronic chest pain, dyspnea on exertion, hyperlipidemia with other history to include anxiety, type 2 diabetes, OSA (not on CPAP, and tobacco abuse.  She was last seen on 03/30/2019 at which time she was planned to have a cardiac CTA, if studies were negative for ischemia, she would be considered for an addition of a low-dose Lasix to evaluate if her symptoms were from diastolic dysfunction.  She was recommended to be referred for a sleep study, and to have repeat lipids and LFTs along with a ESR and a CRP in the setting of pericardial effusion noted on echocardiogram on 03/17/2019.  Cardiac CTA was completed on 05/02/2019 with a calcium score of 0 with normal coronary arteries  and no evidence of coronary artery plaque.  The patient had normal ESR and CRP.  No evidence of acute inflammation.  Echocardiogram was also completed on 03/18/2019 which revealed normal LVEF of 55% to 60%.  Sleep study has yet to be completed. She is scheduled for Jun 06, 2019.   She has not new complaints.   The patient does not have symptoms concerning for COVID-19 infection (fever, chills, cough, or new shortness of breath).    Past Medical History:  Diagnosis Date  . Anxiety   . Anxiety    Past Surgical History:  Procedure Laterality Date  . BACK SURGERY    . BREAST SURGERY       Current Meds  Medication Sig  . ACCU-CHEK SOFTCLIX LANCETS lancets For testing once a day. Dx:E11.65  . ALPRAZolam (XANAX) 0.5 MG tablet Take 1 tablet (0.5 mg total) by mouth as needed for anxiety or sleep.  . AMBULATORY NON FORMULARY MEDICATION Medication Name: glucometer, lancets and strips to test once a day. Dx. Diabetes type 2. 90 days supply  . lansoprazole (PREVACID) 15 MG capsule Take 1 capsule (15 mg total) by mouth at bedtime.  . liraglutide (VICTOZA) 18 MG/3ML SOPN Inject 0.3 mLs (1.8 mg total) into the skin daily.  Marland Kitchen PARoxetine (PAXIL) 40 MG tablet TAKE 1 TABLET BY MOUTH ONCE DAILY  . SitaGLIPtin-MetFORMIN HCl (JANUMET XR) [phone] MG TB24 Take 1 tablet by mouth daily.     Allergies:   Amoxicillin, Codeine, and Vicodin [hydrocodone-acetaminophen]   Social History   Tobacco Use  . Smoking status: Former Smoker  Years: 10.00    Quit date: 09/17/2006    Years since quitting: 12.6  . Smokeless tobacco: Never Used  Substance Use Topics  . Alcohol use: No  . Drug use: No     Family Hx: The patient's family history includes Cancer in her father; Diabetes in an other family member; Heart disease (age of onset: 88) in her brother; Hypertension in her mother.  ROS:   Please see the history of present illness.    All other systems reviewed and are negative.   Prior CV studies:   The  following studies were reviewed today: Cardiac CTA 05/02/2019 IMPRESSION: 1. No evidence of CAD, CADRADS = 0.  2. Coronary calcium score of 0. This was 0 percentile for age and sex matched control.  3. Normal coronary origin with right dominance.  4. Limited assessment of mid LCx territory due to artifact.  5.  Mildly dilated pulmonary artery.  Labs/Other Tests and Data Reviewed:    EKG:  No ECG reviewed.  Recent Labs: 05/02/2019: Creatinine, Ser 0.90   Recent Lipid Panel Lab Results  Component Value Date/Time   CHOL 230 (H) 03/08/2018 08:04 AM   TRIG 188 (H) 03/08/2018 08:04 AM   HDL 43 (L) 03/08/2018 08:04 AM   CHOLHDL 5.3 (H) 03/08/2018 08:04 AM   LDLCALC 155 (H) 03/08/2018 08:04 AM    Wt Readings from Last 3 Encounters:  05/23/19 216 lb (98 kg)  03/30/19 214 lb (97.1 kg)  03/03/19 210 lb (95.3 kg)     Objective:    Vital Signs:  Ht _0  (1.753 m)   Wt 216 lb (98 kg)   LMP  (LMP Unknown)   BMI 31.90 kg/m   Limited due to telephone visit.   VITAL SIGNS:  reviewed GEN:  no acute distress NEURO:  alert and oriented x 3, no obvious focal deficit PSYCH:  normal affect  ASSESSMENT & PLAN:    1. Non cardiac chest pain: Cardiac CTA and echocardiogram normal with the exception of mildly dilated pulmonary artery. She is given explanation of her test results and also questions were answered. Reassurance is given. She verbalizes understanding. She will be seen again PRN.  2, Dyspnea  She is due to have sleep study completed on June 06, 2019. May need referral to Dr.Kelly if found to be positive. Will await the results.   3.Diabetes: Followed by PCP.  COVID-19 Education: The signs and symptoms of COVID-19 were discussed with the patient and how to seek care for testing (follow up with PCP or arrange E-visit).  The importance of social distancing was discussed today.  Time:   Today, I have spent 15 minutes with the patient with telehealth technology discussing  the above problems.     Medication Adjustments/Labs and Tests Ordered: Current medicines are reviewed at length with the patient today.  Concerns regarding medicines are outlined above.   Tests Ordered: No orders of the defined types were placed in this encounter.   Medication Changes: No orders of the defined types were placed in this encounter.   Disposition:  Follow up PRN  Signed, Phill Myron. West Pugh, ANP, AACC  05/23/2019 8:44 AM    [facility]

## 2019-05-23 ENCOUNTER — Telehealth (INDEPENDENT_AMBULATORY_CARE_PROVIDER_SITE_OTHER): Payer: BLUE CROSS/BLUE SHIELD | Admitting: Adult Health

## 2019-05-23 ENCOUNTER — Encounter: Payer: Self-pay | Admitting: Adult Health

## 2019-05-23 VITALS — Ht 69.0 in | Wt 216.0 lb

## 2019-05-23 DIAGNOSIS — E119 Type 2 diabetes mellitus without complications: Secondary | ICD-10-CM

## 2019-05-23 DIAGNOSIS — R0789 Other chest pain: Secondary | ICD-10-CM

## 2019-05-23 DIAGNOSIS — R0609 Other forms of dyspnea: Secondary | ICD-10-CM

## 2019-05-23 DIAGNOSIS — R0600 Dyspnea, unspecified: Secondary | ICD-10-CM

## 2019-05-23 NOTE — Patient Instructions (Signed)

## 2019-06-06 ENCOUNTER — Ambulatory Visit (HOSPITAL_BASED_OUTPATIENT_CLINIC_OR_DEPARTMENT_OTHER): Payer: BLUE CROSS/BLUE SHIELD | Attending: Internal Medicine | Admitting: Cardiovascular Disease

## 2019-06-06 ENCOUNTER — Other Ambulatory Visit: Payer: Self-pay

## 2019-06-06 DIAGNOSIS — G4733 Obstructive sleep apnea (adult) (pediatric): Secondary | ICD-10-CM

## 2019-06-06 DIAGNOSIS — R0602 Shortness of breath: Secondary | ICD-10-CM

## 2019-06-10 DIAGNOSIS — G4733 Obstructive sleep apnea (adult) (pediatric): Secondary | ICD-10-CM | POA: Insufficient documentation

## 2019-06-17 ENCOUNTER — Encounter (HOSPITAL_BASED_OUTPATIENT_CLINIC_OR_DEPARTMENT_OTHER): Payer: Self-pay | Admitting: Cardiovascular Disease

## 2019-06-17 NOTE — Procedures (Signed)
    Patient Name: Jennifer Gray, Jennifer Gray Date: 06/06/2019 Gender: Female D.O.B: 1966-04-28 Age (years): 24 Referring Provider: Cherlynn Kaiser MD Height (inches): 53 Interpreting Physician: Shelva Majestic MD, ABSM Weight (lbs): 215 RPSGT: Jacolyn Reedy BMI: 32 MRN: [id number] Neck Size: 15.00  CLINICAL INFORMATION Sleep Study Type: HST  Indication for sleep study: OSA  Epworth Sleepiness Score: 9  SLEEP STUDY TECHNIQUE A multi-channel overnight portable sleep study was performed. The channels recorded were: nasal airflow, thoracic respiratory movement, and oxygen saturation with a pulse oximetry. Snoring was also monitored.  MEDICATIONS     ACCU-CHEK SOFTCLIX LANCETS lancets         ALPRAZolam (XANAX) 0.5 MG tablet         AMBULATORY NON FORMULARY MEDICATION         lansoprazole (PREVACID) 15 MG capsule         liraglutide (VICTOZA) 18 MG/3ML SOPN         PARoxetine (PAXIL) 40 MG tablet         SitaGLIPtin-MetFORMIN HCl (JANUMET XR) [phone] MG TB24      Patient self administered medications include: N/A.  SLEEP ARCHITECTURE Patient was studied for 377.7 minutes. The sleep efficiency was 99.3 % and the patient was supine for 0%. The arousal index was 0.0 per hour.  RESPIRATORY PARAMETERS The overall AHI was 57.5 per hour, with a central apnea index of 0.0 per hour.  The oxygen nadir was 89% during sleep.  CARDIAC DATA Mean heart rate during sleep was 79.2 bpm.  IMPRESSIONS - Severe obstructive sleep apnea occurred during this study (AHI = 57.5/h). - No significant central sleep apnea occurred during this study (CAI = 0.0/h). - The patient had minimal or no oxygen desaturation during the study (Min O2 = 89%) - Patient snored 45.8% during the sleep.  DIAGNOSIS - Obstructive Sleep Apnea (327.23 [G47.33 ICD-10])  RECOMMENDATIONS - In this patient with severe sleep apnea, recommend an in-lab CPAP titration study for optimal evaluation and treatment of the  patient's severe sleep disorded breathing.  - Effort should be made to optimize nasal and oropharyngeal patency. - Avoid alcohol, sedatives and other CNS depressants that may worsen sleep apnea and disrupt normal sleep architecture. - Sleep hygiene should be reviewed to assess factors that may improve sleep quality. - Weight management and regular exercise should be initiated or continued. - Recommend a download be obtained after 30 days of CPAP initiation and sleep clinic evaluation.    [Electronically signed] 06/17/2019 05:11 PM  Shelva Majestic MD, [facility], [place], American Board of Sleep Medicine   NPI: [id number] [place] PH: [phone]   FX: [phone] [place]

## 2019-06-22 ENCOUNTER — Encounter: Payer: Self-pay | Admitting: Family Medicine

## 2019-06-24 ENCOUNTER — Telehealth: Payer: Self-pay | Admitting: *Deleted

## 2019-06-24 ENCOUNTER — Other Ambulatory Visit: Payer: Self-pay | Admitting: Cardiovascular Disease

## 2019-06-24 DIAGNOSIS — G4733 Obstructive sleep apnea (adult) (pediatric): Secondary | ICD-10-CM

## 2019-06-24 NOTE — Telephone Encounter (Signed)
Left message to return a call to discuss HST results and recommendations. 

## 2019-06-28 ENCOUNTER — Other Ambulatory Visit: Payer: Self-pay | Admitting: Cardiovascular Disease

## 2019-06-28 DIAGNOSIS — G4733 Obstructive sleep apnea (adult) (pediatric): Secondary | ICD-10-CM

## 2019-06-28 NOTE — Telephone Encounter (Signed)
Patient notified of HST results and recommendations. She agrees to having CPAP titration study.

## 2019-07-01 ENCOUNTER — Other Ambulatory Visit: Payer: Self-pay | Admitting: Family Medicine

## 2019-07-12 ENCOUNTER — Telehealth: Payer: Self-pay | Admitting: *Deleted

## 2019-07-12 NOTE — Telephone Encounter (Signed)
[employer] denied CPAP titration. Authorized APAP. Orders sent to [facility].

## 2019-07-14 ENCOUNTER — Ambulatory Visit: Payer: BLUE CROSS/BLUE SHIELD | Admitting: Family Medicine

## 2019-08-11 ENCOUNTER — Other Ambulatory Visit: Payer: Self-pay | Admitting: Family Medicine

## 2019-08-11 DIAGNOSIS — Z1231 Encounter for screening mammogram for malignant neoplasm of breast: Secondary | ICD-10-CM

## 2019-09-19 ENCOUNTER — Other Ambulatory Visit: Payer: Self-pay | Admitting: Family Medicine

## 2019-10-25 ENCOUNTER — Other Ambulatory Visit: Payer: Self-pay

## 2019-10-25 ENCOUNTER — Encounter: Payer: Self-pay | Admitting: Nurse Practitioner

## 2019-10-25 ENCOUNTER — Ambulatory Visit (INDEPENDENT_AMBULATORY_CARE_PROVIDER_SITE_OTHER): Payer: BLUE CROSS/BLUE SHIELD | Admitting: Nurse Practitioner

## 2019-10-25 VITALS — BP 142/85 | HR 94 | Temp 98.3°F | Ht 69.0 in | Wt 222.0 lb

## 2019-10-25 DIAGNOSIS — K5904 Chronic idiopathic constipation: Secondary | ICD-10-CM

## 2019-10-25 DIAGNOSIS — F419 Anxiety disorder, unspecified: Secondary | ICD-10-CM | POA: Diagnosis not present

## 2019-10-25 DIAGNOSIS — F329 Major depressive disorder, single episode, unspecified: Secondary | ICD-10-CM

## 2019-10-25 DIAGNOSIS — E1165 Type 2 diabetes mellitus with hyperglycemia: Secondary | ICD-10-CM | POA: Diagnosis not present

## 2019-10-25 DIAGNOSIS — F32A Depression, unspecified: Secondary | ICD-10-CM

## 2019-10-25 LAB — POCT UA - MICROALBUMIN
Albumin/Creatinine Ratio, Urine, POC: <30
Creatinine, POC: 300 mg/dL
Microalbumin Ur, POC: 10 mg/L

## 2019-10-25 LAB — POCT GLYCOSYLATED HEMOGLOBIN (HGB A1C): Hemoglobin A1C: 7.9 % — AB (ref 4.0–5.6)

## 2019-10-25 MED ORDER — VICTOZA 18 MG/3ML ~~LOC~~ SOPN: 1.8000 mg | PEN_INJECTOR | Freq: Every day | SUBCUTANEOUS | 2 refills | Status: DC

## 2019-10-25 MED ORDER — ALPRAZOLAM 0.5 MG PO TABS: 0.5000 mg | ORAL_TABLET | ORAL | 1 refills | Status: DC | PRN

## 2019-10-25 MED ORDER — JANUMET XR 100-1000 MG PO TB24: 1.0000 | ORAL_TABLET | Freq: Every day | ORAL | 3 refills | Status: DC

## 2019-10-25 NOTE — Progress Notes (Addendum)
Established Patient Office Visit  Subjective:  Patient ID: Jennifer Gray, female    DOB: 05-09-1966  Age: 54 y.o. MRN: [id number]  CC:  Chief Complaint  Patient presents with  . Diabetes  . Anxiety    HPI Jennifer Gray presents for follow-up for diabetes and anxiety.   ANXIETY She reports that she has been under a tremendous amount of stress over the last 3-4 months with buying a new house, her husband looosing his job, and then her daughter giving birth to her granddaughter prematurely. She states these events have had a serious effect on her emotional state as well as her health recently.   Duration:exacerbated Anxious mood: yes  Excessive worrying: yes Irritability: yes  Sweating: no Nausea: no Palpitations:yes Hyperventilation: no Panic attacks: no Obscessions/compulsions: yes Depressed mood: yes  Anhedonia: yes Weight changes: yes Insomnia: yes hard to fall asleep  Hypersomnia: no Fatigue/loss of energy: yes Feelings of worthlessness: yes Feelings of guilt: yes Impaired concentration/indecisiveness: yes Suicidal ideations: no  Crying spells: yes Recent Stressors/Life Changes: yes   Relationship problems: no   Family stress: yes     Financial stress: yes    Job stress: yes    Recent death/loss: no Depression screen [facility] 2/9 10/25/2019 01/18/2019 09/17/2018 03/05/2018 10/22/2017  Decreased Interest 2 2 2 1 1   Down, Depressed, Hopeless 2 3 2 1 2   PHQ - 2 Score 4 5 4 2 3   Altered sleeping 2 2 2  0 2  Tired, decreased energy 2 2 2 3 2   Change in appetite 2 0 0 1 1  Feeling bad or failure about yourself  0 0 2 2 2   Trouble concentrating 0 0 2 1 0  Moving slowly or fidgety/restless 0 0 0 1 0  Suicidal thoughts 0 0 0 0 0  PHQ-9 Score 10 9 12 10 10   Difficult doing work/chores Somewhat difficult Not difficult at all Somewhat difficult - Somewhat difficult   DIABETES She reports that she has not been following her diet and exercise regimen lately due to increased  stress. She admits to eating more carbohydrates that she should and she has not been checking her blood sugars on a regular basis. She does check her sugar when she is feeling bad (maybe every 6 weeks or so) and reports the readings usually run between 140-160, but never over 200. She has been out of her janumet for a little less than a week.   Taking medications as prescribed: yes Glucose Monitoring: no  Accucheck frequency: Not Checking  Fasting glucose:  Post prandial:  Evening:  Before meals: Hypoglycemic episodes:no Polydipsia/polyuria: no Visual changes: no Chest pain: no Paresthesias: minimal in her feet Blood Pressure Monitoring: not checking Retinal Examination: Not up to Date Foot Exam: performed today Diabetic Education: Completed Pneumovax: Not up to Date Influenza: Not up to Date Aspirin: no   Diabetic Foot Exam - Simple   Simple Foot Form Diabetic Foot exam was performed with the following findings: Yes 10/25/2019 11:32 AM  Visual Inspection No deformities, no ulcerations, no other skin breakdown bilaterally: Yes Sensation Testing Intact to touch and monofilament testing bilaterally: Yes Pulse Check Comments    CONSTIPATION Patient reports frequent and chronic issues with constipation. She states that this has been a lifelong issue. She reports passing stool approximately once a week with frequent episodes of bloating abdominal distention. She has used miralax intermittently in the past with some success, but denies using anything on a regular basis.   She admits that  she is behind on many of her health maintenance activities including flu vaccine, mammogram, eye exam, and pneumonia vaccine. At this time, financial limitations prevent her from having these done, but she states that she will work on these when it is financially feasible.    Past Medical History:  Diagnosis Date  . Anxiety   . Anxiety     Past Surgical History:  Procedure Laterality Date  .  BACK SURGERY    . BREAST SURGERY      Family History  Problem Relation Age of Onset  . Hypertension Mother   . Cancer Father   . Diabetes Other   . Heart disease Brother 16    Social History   Socioeconomic History  . Marital status: Married    Spouse name: Not on file  . Number of children: Not on file  . Years of education: Not on file  . Highest education level: Not on file  Occupational History  . Not on file  Tobacco Use  . Smoking status: Former Smoker    Years: 10.00    Quit date: 09/17/2006    Years since quitting: 13.1  . Smokeless tobacco: Never Used  Substance and Sexual Activity  . Alcohol use: No  . Drug use: No  . Sexual activity: Not on file  Other Topics Concern  . Not on file  Social History Narrative  . Not on file   Social Determinants of Health   Financial Resource Strain:   . Difficulty of Paying Living Expenses: Not on file  Food Insecurity:   . Worried About Charity fundraiser in the Last Year: Not on file  . Ran Out of Food in the Last Year: Not on file  Transportation Needs:   . Lack of Transportation (Medical): Not on file  . Lack of Transportation (Non-Medical): Not on file  Physical Activity:   . Days of Exercise per Week: Not on file  . Minutes of Exercise per Session: Not on file  Stress:   . Feeling of Stress : Not on file  Social Connections:   . Frequency of Communication with Friends and Family: Not on file  . Frequency of Social Gatherings with Friends and Family: Not on file  . Attends Religious Services: Not on file  . Active Member of Clubs or Organizations: Not on file  . Attends Archivist Meetings: Not on file  . Marital Status: Not on file  Intimate Partner Violence:   . Fear of Current or Ex-Partner: Not on file  . Emotionally Abused: Not on file  . Physically Abused: Not on file  . Sexually Abused: Not on file    Outpatient Medications Prior to Visit  Medication Sig Dispense Refill  . ACCU-CHEK  SOFTCLIX LANCETS lancets For testing once a day. Dx:E11.65 100 each 12  . AMBULATORY NON FORMULARY MEDICATION Medication Name: glucometer, lancets and strips to test once a day. Dx. Diabetes type 2. 90 days supply 1 Units PRN  . lansoprazole (PREVACID) 15 MG capsule Take 1 capsule by mouth at bedtime 30 capsule 0  . PARoxetine (PAXIL) 40 MG tablet TAKE 1 TABLET BY MOUTH ONCE DAILY 90 tablet 1  . ALPRAZolam (XANAX) 0.5 MG tablet Take 1 tablet (0.5 mg total) by mouth as needed for anxiety or sleep. 30 tablet 1  . liraglutide (VICTOZA) 18 MG/3ML SOPN Inject 0.3 mLs (1.8 mg total) into the skin daily. 18 mL 3  . SitaGLIPtin-MetFORMIN HCl (JANUMET XR) [id number] MG TB24 Take  1 tablet by mouth daily. [place].APPOINTMENT REQUIRED FOR REFILLS 30 tablet 0   No facility-administered medications prior to visit.    Allergies  Allergen Reactions  . Amoxicillin Shortness Of Breath and Rash  . Codeine Other (See Comments)    Headache  . Vicodin [Hydrocodone-Acetaminophen]     ROS Review of Systems  Constitutional: Positive for fatigue. Negative for chills and fever.  Respiratory: Positive for shortness of breath. Negative for chest tightness.   Cardiovascular: Negative for chest pain, palpitations and leg swelling.  Gastrointestinal: Positive for abdominal distention and constipation. Negative for abdominal pain, blood in stool, diarrhea, nausea and rectal pain.  Endocrine: Negative for polydipsia, polyphagia and polyuria.  Genitourinary: Negative for decreased urine volume and urgency.  Neurological: Negative for weakness, light-headedness and headaches.  Psychiatric/Behavioral: Positive for sleep disturbance. The patient is nervous/anxious.       Objective:    Physical Exam  Constitutional: She is oriented to person, place, and time. She appears well-developed and well-nourished.  HENT:  Head: Normocephalic.  Right Ear: External ear normal.  Left Ear: External ear normal.  Eyes:  Pupils are equal, round, and reactive to light.  Neck: No JVD present. No thyromegaly present.  Cardiovascular: Normal rate, regular rhythm and normal heart sounds.  Pulmonary/Chest: Effort normal and breath sounds normal.  Abdominal: Soft. Bowel sounds are normal. She exhibits distension. There is no hepatosplenomegaly. There is no abdominal tenderness.  Musculoskeletal:        General: No edema. Normal range of motion.     Cervical back: Normal range of motion.  Neurological: She is alert and oriented to person, place, and time. She has normal reflexes.  Skin: Skin is warm and dry.  Psychiatric: Her speech is normal and behavior is normal. Thought content normal. Cognition and memory are normal. She exhibits a depressed mood.  Nursing note and vitals reviewed.   BP (!) 142/85   Pulse 94   Temp 98.3 F (36.8 C) (Oral)   Ht 5\' 9"  (1.753 m)   Wt 222 lb 0.6 oz (100.7 kg)   LMP  (LMP Unknown)   SpO2 96%   BMI 32.79 kg/m  Wt Readings from Last 3 Encounters:  10/25/19 222 lb 0.6 oz (100.7 kg)  06/06/19 215 lb (97.5 kg)  05/23/19 216 lb (98 kg)     Health Maintenance Due  Topic Date Due  . PNEUMOCOCCAL POLYSACCHARIDE VACCINE AGE 61-64 HIGH RISK  01/13/1967  . OPHTHALMOLOGY EXAM  01/13/1975  . TETANUS/TDAP  01/16/2019  . INFLUENZA VACCINE  04/02/2019    There are no preventive care reminders to display for this patient.  Lab Results  Component Value Date   TSH 3.80 03/08/2018   Lab Results  Component Value Date   WBC 7.1 03/08/2018   HGB 13.2 03/08/2018   HCT 39.3 03/08/2018   MCV 75.3 (L) 03/08/2018   PLT 422 (H) 03/08/2018   Lab Results  Component Value Date   NA 140 03/08/2018   K 4.5 03/08/2018   CO2 22 03/08/2018   GLUCOSE 160 (H) 03/08/2018   BUN 17 03/08/2018   CREATININE 0.90 05/02/2019   BILITOT 0.4 03/08/2018   ALKPHOS 70 10/22/2016   AST 17 03/08/2018   ALT 34 (H) 03/08/2018   PROT 6.9 03/08/2018   ALBUMIN 4.4 10/22/2016   CALCIUM 9.5 03/08/2018    Lab Results  Component Value Date   CHOL 230 (H) 03/08/2018   Lab Results  Component Value Date  HDL 43 (L) 03/08/2018   Lab Results  Component Value Date   LDLCALC 155 (H) 03/08/2018   Lab Results  Component Value Date   TRIG 188 (H) 03/08/2018   Lab Results  Component Value Date   CHOLHDL 5.3 (H) 03/08/2018   Lab Results  Component Value Date   HGBA1C 7.9 (A) 10/25/2019      Assessment & Plan:   1. Controlled type 2 diabetes mellitus with hyperglycemia, without long-term current use of insulin (HCC) Hemoglobin A1c is elevated today, however I feel this is most likely due to increased anxiety, depression, and changes related to recent life events. She has been fairly well controlled in the past with her current medication regimen and admits to poor dietary habits recently. I would like for her to work on restoring her diet and incorporating exercise into her daily regimen before considering changing or adding more medication.  CMP today. No evidence of microalbuminuria. We will follow up with patient in approximately 6 months to allow her time to make the lifestyle changes necessary to maintain better control of her diabetes. - COMPLETE METABOLIC PANEL WITH GFR - POCT UA - Microalbumin - POCT glycosylated hemoglobin (Hb A1C)  2. Anxiety and depression She is using alprazolam very sparingly to help with anxiety at bedtime. Discussed with the patient the need to continue this infrequent use. Her depressive symptoms and exacerbation of anxiety appear to be very situational related to recent life events. Discussed the option of starting a daily medication to help with these symptoms, but she defers this option at this time.  Patient instructed to contact the office if she changes her mind about starting a daily SSRI for anxiety and depression. We will follow up in approximately 6 months or sooner if the patient needs.  3. Chronic idiopathic constipation Instructions  provided for patient for use of mag citrate to help Facilitate a bowel movement followed by daily to every other day use of MiraLAX for approximately 2 weeks.  Discussed with the patient that at that time if she is unable to have regular bowel movements that MiraLAX can be continued 2 to 3 days/week.  Discussed the importance of making sure she is well-hydrated. Once her financial situation has improved if issues with constipation continue we may consider a GI referral.  If patients begins to experience other signs or symptoms of gastrointestinal distress referral would be warranted sooner. Patient to follow-up if symptoms fail to improve or worsen.  Follow-up: Return in about 6 months (around 04/23/2020) for DM/Anxiety.    Orma Render, NP

## 2019-10-25 NOTE — Patient Instructions (Addendum)
Magnesium citrate can be found at the pharmacy. Take this to do a bowel clean out- be prepared to go to the bathroom quite a bit after you take it.   Then do a dose of miralax every day to every other day for 4-6 weeks to get yourself on a regular cycle of bowel movements and allow your colon to shrink a little. Be sure to drink PLENTY of fluids and stay well hydrated.   You can continue to use the miralax every other day if needed after this.    Constipation, Adult Constipation is when a person:  Poops (has a bowel movement) fewer times in a week than normal.  Has a hard time pooping.  Has poop that is dry, hard, or bigger than normal. Follow these instructions at home: Eating and drinking   Eat foods that have a lot of fiber, such as: ? Fresh fruits and vegetables. ? Whole grains. ? Beans.  Eat less of foods that are high in fat, low in fiber, or overly processed, such as: ? [place] fries. ? Hamburgers. ? Cookies. ? Candy. ? Soda.  Drink enough fluid to keep your pee (urine) clear or pale yellow. General instructions  Exercise regularly or as told by your doctor.  Go to the restroom when you feel like you need to poop. Do not hold it in.  Take over-the-counter and prescription medicines only as told by your doctor. These include any fiber supplements.  Do pelvic floor retraining exercises, such as: ? Doing deep breathing while relaxing your lower belly (abdomen). ? Relaxing your pelvic floor while pooping.  Watch your condition for any changes.  Keep all follow-up visits as told by your doctor. This is important. Contact a doctor if:  You have pain that gets worse.  You have a fever.  You have not pooped for 4 days.  You throw up (vomit).  You are not hungry.  You lose weight.  You are bleeding from the anus.  You have thin, pencil-like poop (stool). Get help right away if:  You have a fever, and your symptoms suddenly get worse.  You leak poop or  have blood in your poop.  Your belly feels hard or bigger than normal (is bloated).  You have very bad belly pain.  You feel dizzy or you faint. This information is not intended to replace advice given to you by your health care provider. Make sure you discuss any questions you have with your health care provider. Document Revised: 07/31/2017 Document Reviewed: 02/06/2016   Elsevier Patient Education  [place].   Diabetes Mellitus and Exercise Exercising regularly is important for your overall health, especially when you have diabetes (diabetes mellitus). Exercising is not only about losing weight. It has many other health benefits, such as increasing muscle strength and bone density and reducing body fat and stress. This leads to improved fitness, flexibility, and endurance, all of which result in better overall health. Exercise has additional benefits for people with diabetes, including:  Reducing appetite.  Helping to lower and control blood glucose.  Lowering blood pressure.  Helping to control amounts of fatty substances (lipids) in the blood, such as cholesterol and triglycerides.  Helping the body to respond better to insulin (improving insulin sensitivity).  Reducing how much insulin the body needs.  Decreasing the risk for heart disease by: ? Lowering cholesterol and triglyceride levels. ? Increasing the levels of good cholesterol. ? Lowering blood glucose levels. What is my activity plan? Your  health care provider or certified diabetes educator can help you make a plan for the type and frequency of exercise (activity plan) that works for you. Make sure that you:  Do at least 150 minutes of moderate-intensity or vigorous-intensity exercise each week. This could be brisk walking, biking, or water aerobics. ? Do stretching and strength exercises, such as yoga or weightlifting, at least 2 times a week. ? Spread out your activity over at least 3 days of the  week.  Get some form of physical activity every day. ? Do not go more than 2 days in a row without some kind of physical activity. ? Avoid being inactive for more than 30 minutes at a time. Take frequent breaks to walk or stretch.  Choose a type of exercise or activity that you enjoy, and set realistic goals.  Start slowly, and gradually increase the intensity of your exercise over time. What do I need to know about managing my diabetes?   Check your blood glucose before and after exercising. ? If your blood glucose is 240 mg/dL (13.3 mmol/L) or higher before you exercise, check your urine for ketones. If you have ketones in your urine, do not exercise until your blood glucose returns to normal. ? If your blood glucose is 100 mg/dL (5.6 mmol/L) or lower, eat a snack containing 15-20 grams of carbohydrate. Check your blood glucose 15 minutes after the snack to make sure that your level is above 100 mg/dL (5.6 mmol/L) before you start your exercise.  Know the symptoms of low blood glucose (hypoglycemia) and how to treat it. Your risk for hypoglycemia increases during and after exercise. Common symptoms of hypoglycemia can include: ? Hunger. ? Anxiety. ? Sweating and feeling clammy. ? Confusion. ? Dizziness or feeling light-headed. ? Increased heart rate or palpitations. ? Blurry vision. ? Tingling or numbness around the mouth, lips, or tongue. ? Tremors or shakes. ? Irritability.  Keep a rapid-acting carbohydrate snack available before, during, and after exercise to help prevent or treat hypoglycemia.  Avoid injecting insulin into areas of the body that are going to be exercised. For example, avoid injecting insulin into: ? The arms, when playing tennis. ? The legs, when jogging.  Keep records of your exercise habits. Doing this can help you and your health care provider adjust your diabetes management plan as needed. Write down: ? Food that you eat before and after you  exercise. ? Blood glucose levels before and after you exercise. ? The type and amount of exercise you have done. ? When your insulin is expected to peak, if you use insulin. Avoid exercising at times when your insulin is peaking.  When you start a new exercise or activity, work with your health care provider to make sure the activity is safe for you, and to adjust your insulin, medicines, or food intake as needed.  Drink plenty of water while you exercise to prevent dehydration or heat stroke. Drink enough fluid to keep your urine clear or pale yellow. Summary  Exercising regularly is important for your overall health, especially when you have diabetes (diabetes mellitus).  Exercising has many health benefits, such as increasing muscle strength and bone density and reducing body fat and stress.  Your health care provider or certified diabetes educator can help you make a plan for the type and frequency of exercise (activity plan) that works for you.  When you start a new exercise or activity, work with your health care provider to make sure the  activity is safe for you, and to adjust your insulin, medicines, or food intake as needed. This information is not intended to replace advice given to you by your health care provider. Make sure you discuss any questions you have with your health care provider. Document Revised: 03/12/2017 Document Reviewed: 01/28/2016 Elsevier Patient Education  [place].

## 2019-10-26 ENCOUNTER — Telehealth: Payer: Self-pay

## 2019-10-26 DIAGNOSIS — F4010 Social phobia, unspecified: Secondary | ICD-10-CM

## 2019-10-26 LAB — COMPLETE METABOLIC PANEL WITH GFR
AG Ratio: 1.7 (calc) (ref 1.0–2.5)
ALT: 22 U/L (ref 6–29)
AST: 15 U/L (ref 10–35)
Albumin: 4.3 g/dL (ref 3.6–5.1)
Alkaline phosphatase (APISO): 68 U/L (ref 37–153)
BUN: 14 mg/dL (ref 7–25)
CO2: 25 mmol/L (ref 20–32)
Calcium: 9.5 mg/dL (ref 8.6–10.4)
Chloride: 103 mmol/L (ref 98–110)
Creat: 0.9 mg/dL (ref 0.50–1.05)
GFR, Est African American: 84 mL/min/{1.73_m2} (ref >=60)
GFR, Est Non African American: 72 mL/min/{1.73_m2} (ref >=60)
Globulin: 2.5 g/dL (calc) (ref 1.9–3.7)
Glucose, Bld: 185 mg/dL — ABNORMAL HIGH (ref 65–139)
Potassium: 4.3 mmol/L (ref 3.5–5.3)
Sodium: 138 mmol/L (ref 135–146)
Total Bilirubin: 0.4 mg/dL (ref 0.2–1.2)
Total Protein: 6.8 g/dL (ref 6.1–8.1)

## 2019-10-26 MED ORDER — ALPRAZOLAM 0.5 MG PO TABS: 0.5000 mg | ORAL_TABLET | Freq: Every day | ORAL | 1 refills | Status: DC | PRN

## 2019-10-26 NOTE — Telephone Encounter (Signed)
Alprazolam prescription changed to read 1 (0.5mg ) tab by mouth daily as needed for sleep. New prescription sent to pharmacy.

## 2019-10-26 NOTE — Telephone Encounter (Signed)
Jennifer Gray from [facility] called regarding alprazolam rx. As per pharmacist, a technician took the annotated notes from [facility] in triage for the rx, which is not permissible in [place]. Unable to accept verbal notes / changes for rx. Requesting for provider to send in a new rx for alprazolam - 1 tab / once daily, prn for sleep. Thanks.

## 2019-11-14 ENCOUNTER — Telehealth: Payer: Self-pay | Admitting: Family Medicine

## 2019-11-14 NOTE — Telephone Encounter (Signed)
Please call pt: I review her note with Sarabeth and her last A1C.  Please have her schedule a f/U in 10 weeks for her diabetes so we can make sure her diabetes improves and to get her care  Gaps  Closed. I would also recommend a statin at bedtime bc of her diabetes to reduce h er risk fo heart disease and stroke with her diagnosis of diabetes.

## 2019-11-15 NOTE — Telephone Encounter (Signed)
appt scheduled for 5/14. Pt is aware.Jennifer Gray, Jennifer Gray, CMA

## 2019-12-19 IMAGING — CT CT HEART MORP W/ CTA COR W/ SCORE W/ CA W/CM &/OR W/O CM
4 of 7 series · 8 of 20 positions shown, 9 images · IV contrast (APPLIED)
Comparison: None.
COMPARISON: None.

Addendum:
EXAM:
OVER-READ INTERPRETATION  CT CHEST

The following report is an over-read performed by radiologist Dr.
Nazareth Jumper [[email or web address]] on 05/02/2019. This
over-read does not include interpretation of cardiac or coronary
anatomy or pathology. The coronary calcium score/coronary CTA
interpretation by the cardiologist is attached.
HISTORY: chest pain , shortness of breathe
Cardiac/Coronary CT
TECHNIQUE: The patient was scanned on a Siemens Force scanner.
PROTOCOL: A 120 kV prospective scan was triggered in the descending thoracic
aorta at 111 HU's. Axial non-contrast 3 mm slices were carried out
through the heart. The data set was analyzed on a dedicated work
station and scored using the Agatson method. Gantry rotation speed
was 250 msecs and collimation was 0.6 mm. Beta blockade, diltiazem
and 0.8 mg of sl NTG was given. The 3D data set was reconstructed in
5% intervals of 35-75% of the R-R cycle. Diastolic phases were
analyzed on a dedicated work station using MPR, MIP and VRT modes.
The patient received 80mL OMNIPAQUE IOHEXOL 350 MG/ML SOLN of
contrast.

[Series 6: best diast · axial · 0.39mm/px · z∈[-151,-108]mm · 2 of 318 slices shown, 3 images]
[im 106/318  vessel]
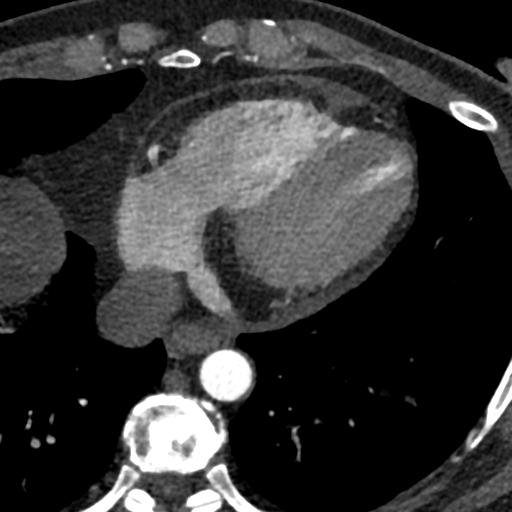
[im 106/318  lung]
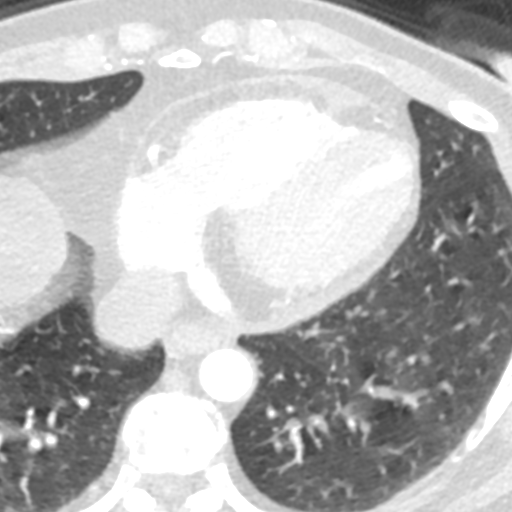
[im 212/318  vessel]
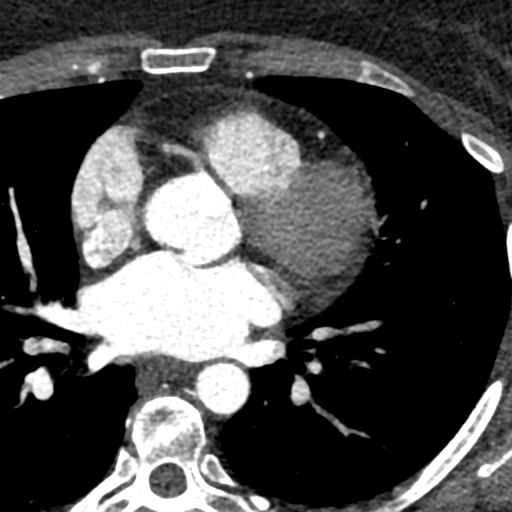

[Series 7: best syst · axial · 0.39mm/px · z∈[-151,-108]mm · 2 of 318 slices shown]
[im 106/318  vessel]
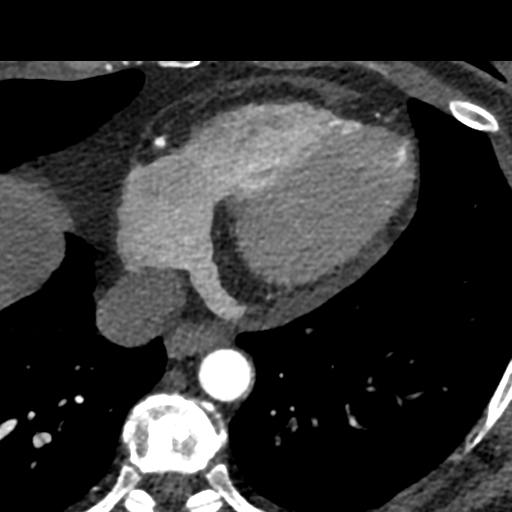
[im 212/318  vessel]
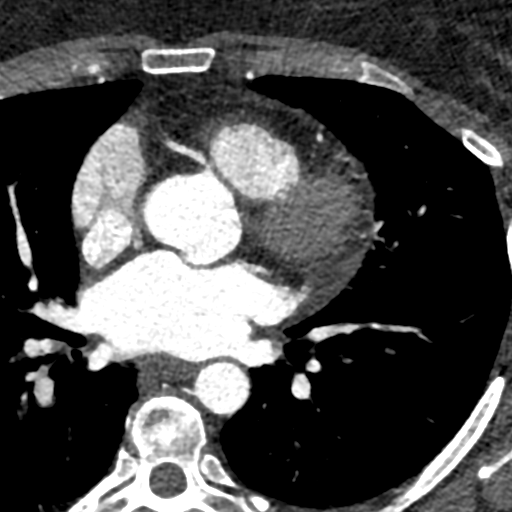

[Series 8: ts diast sharp · axial · 0.39mm/px · z∈[-151,-108]mm · 2 of 318 slices shown]
[im 106/318  lung]
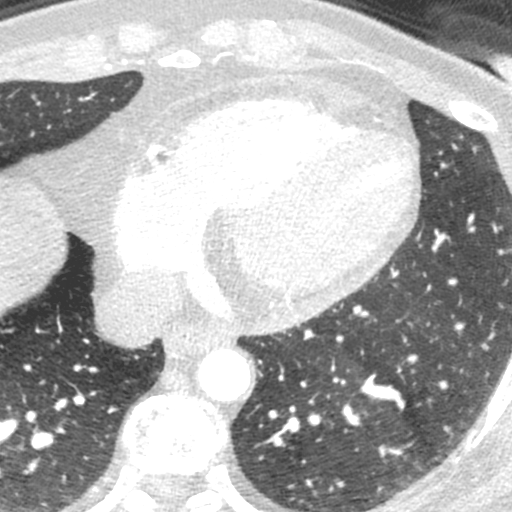
[im 212/318  lung]
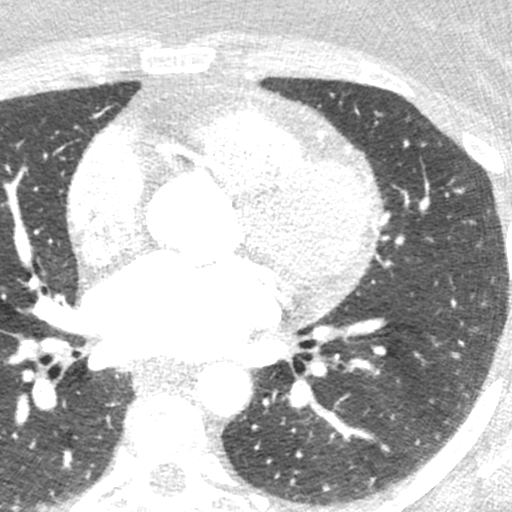

[Series 9: ts syst sharp · axial · 0.39mm/px · z∈[-151,-108]mm · 2 of 318 slices shown]
[im 106/318  lung]
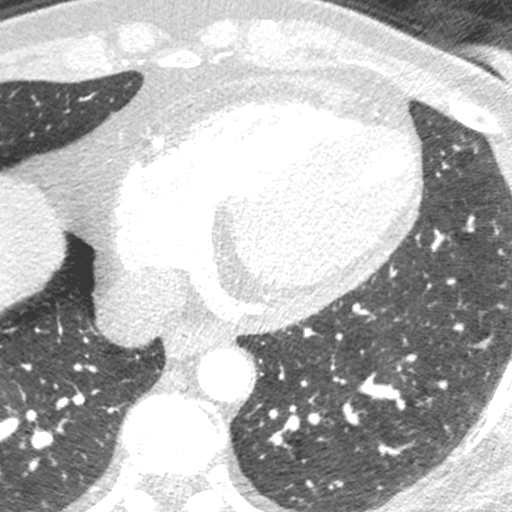
[im 212/318  lung]
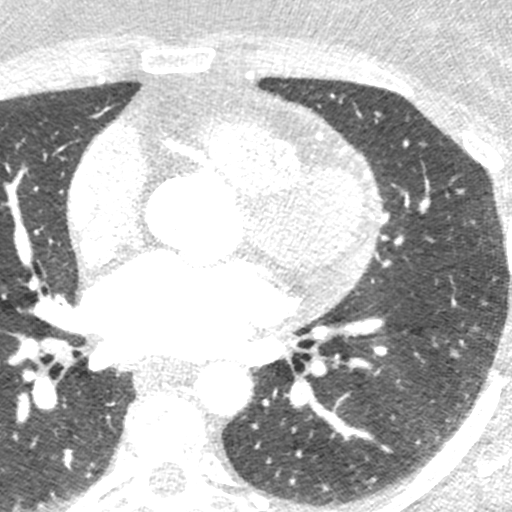

[8 of 20 positions shown; findings below may reference images not displayed]

FINDINGS: Within the visualized portions of the thorax there are no suspicious
appearing pulmonary nodules or masses, there is no acute
consolidative airspace disease, no pleural effusions, no
pneumothorax and no lymphadenopathy. Visualized portions of the
upper abdomen demonstrates mild diffuse low attenuation throughout
the visualized hepatic parenchyma, indicative of a background of
hepatic steatosis. There are no aggressive appearing lytic or
blastic lesions noted in the visualized portions of the skeleton.
IMPRESSION: 1. Hepatic steatosis.
FINDINGS: Coronary calcium score: The patient's coronary artery calcium score
is 0, which places the patient in the 0 percentile.

Coronary arteries: Normal coronary origins.  Right dominance.

Right Coronary Artery: No significant stenosis or plaque.

Left Main Coronary Artery: No significant stenosis or plaque.

Left Anterior Descending Coronary Artery: No significant stenosis or
plaque.

Left Circumflex Artery: Partially degraded image at mid-LCx; no
apparent plaque or stenosis, but limited sensitivity given motion
artifact. No significant plaque or stenosis in proximal or distal
portion.

Aorta: Normal size, 30 mm at the mid ascending aorta (level of the
PA bifurcation) measured double oblique. No calcifications. No
dissection.

Aortic Valve: No calcifications. Trileaflet.

Other findings:

Normal pulmonary vein drainage into the left atrium.

Normal left atrial appendage without a thrombus.

Pulmonary artery is mildly dilated at 32 mm.
IMPRESSION: 1. No evidence of CAD, CADRADS = 0.

2. Coronary calcium score of 0. This was 0 percentile for age and
sex matched control.

3. Normal coronary origin with right dominance.

4. Limited assessment of mid LCx territory due to artifact.

5.  Mildly dilated pulmonary artery.

*** End of Addendum ***
EXAM:
OVER-READ INTERPRETATION  CT CHEST

The following report is an over-read performed by radiologist Dr.
Nazareth Jumper [REDACTED] on 05/02/2019. This
over-read does not include interpretation of cardiac or coronary
anatomy or pathology. The coronary calcium score/coronary CTA
interpretation by the cardiologist is attached.
FINDINGS: Within the visualized portions of the thorax there are no suspicious
appearing pulmonary nodules or masses, there is no acute
consolidative airspace disease, no pleural effusions, no
pneumothorax and no lymphadenopathy. Visualized portions of the
upper abdomen demonstrates mild diffuse low attenuation throughout
the visualized hepatic parenchyma, indicative of a background of
hepatic steatosis. There are no aggressive appearing lytic or
blastic lesions noted in the visualized portions of the skeleton.
IMPRESSION: 1. Hepatic steatosis.

## 2019-12-25 ENCOUNTER — Other Ambulatory Visit: Payer: Self-pay | Admitting: Family Medicine

## 2020-01-13 ENCOUNTER — Ambulatory Visit (INDEPENDENT_AMBULATORY_CARE_PROVIDER_SITE_OTHER): Payer: BLUE CROSS/BLUE SHIELD

## 2020-01-13 ENCOUNTER — Encounter: Payer: Self-pay | Admitting: Family Medicine

## 2020-01-13 ENCOUNTER — Other Ambulatory Visit: Payer: Self-pay

## 2020-01-13 ENCOUNTER — Ambulatory Visit (INDEPENDENT_AMBULATORY_CARE_PROVIDER_SITE_OTHER): Payer: BLUE CROSS/BLUE SHIELD | Admitting: Family Medicine

## 2020-01-13 VITALS — BP 115/68 | HR 89 | Ht 69.0 in | Wt 212.0 lb

## 2020-01-13 DIAGNOSIS — M79671 Pain in right foot: Secondary | ICD-10-CM | POA: Diagnosis not present

## 2020-01-13 DIAGNOSIS — M71349 Other bursal cyst, unspecified hand: Secondary | ICD-10-CM | POA: Diagnosis not present

## 2020-01-13 DIAGNOSIS — Z23 Encounter for immunization: Secondary | ICD-10-CM | POA: Diagnosis not present

## 2020-01-13 DIAGNOSIS — E119 Type 2 diabetes mellitus without complications: Secondary | ICD-10-CM

## 2020-01-13 LAB — POCT GLYCOSYLATED HEMOGLOBIN (HGB A1C): Hemoglobin A1C: 7.1 % — AB (ref 4.0–5.6)

## 2020-01-13 MED ORDER — VICTOZA 18 MG/3ML ~~LOC~~ SOPN: 1.8000 mg | PEN_INJECTOR | Freq: Every day | SUBCUTANEOUS | 2 refills | Status: DC

## 2020-01-13 MED ORDER — JANUMET XR 100-1000 MG PO TB24: 1.0000 | ORAL_TABLET | Freq: Every day | ORAL | 1 refills | Status: DC

## 2020-01-13 NOTE — Progress Notes (Signed)
Established Patient Office Visit  Subjective:  Patient ID: Jennifer Gray, female    DOB: 1966/06/19  Age: 54 y.o. MRN: [id number]  CC:  Chief Complaint  Patient presents with  . Diabetes  . mood    HPI Darlis Follett presents for Diabetes - no hypoglycemic events. No wounds or sores that are not healing well. No increased thirst or urination. Checking glucose at home. Taking medications as prescribed without any side effects.  C/o of posterior right heel pain for months. Painful to wear her shoe and wears flip flips a lot.  Has noticed a lump. No injury or truam.    Also c/o of a knot on her PIP of the 5th digit on her right hand. Feels sore frequently. Again no injury.    Past Medical History:  Diagnosis Date  . Anxiety   . Anxiety     Past Surgical History:  Procedure Laterality Date  . BACK SURGERY    . BREAST SURGERY      Family History  Problem Relation Age of Onset  . Hypertension Mother   . Cancer Father   . Diabetes Other   . Heart disease Brother 35    Social History   Socioeconomic History  . Marital status: Married    Spouse name: Not on file  . Number of children: Not on file  . Years of education: Not on file  . Highest education level: Not on file  Occupational History  . Not on file  Tobacco Use  . Smoking status: Former Smoker    Years: 10.00    Quit date: 09/17/2006    Years since quitting: 13.3  . Smokeless tobacco: Never Used  Substance and Sexual Activity  . Alcohol use: No  . Drug use: No  . Sexual activity: Not on file  Other Topics Concern  . Not on file  Social History Narrative  . Not on file   Social Determinants of Health   Financial Resource Strain:   . Difficulty of Paying Living Expenses:   Food Insecurity:   . Worried About Charity fundraiser in the Last Year:   . Arboriculturist in the Last Year:   Transportation Needs:   . Film/video editor (Medical):   Marland Kitchen Lack of Transportation (Non-Medical):    Physical Activity:   . Days of Exercise per Week:   . Minutes of Exercise per Session:   Stress:   . Feeling of Stress :   Social Connections:   . Frequency of Communication with Friends and Family:   . Frequency of Social Gatherings with Friends and Family:   . Attends Religious Services:   . Active Member of Clubs or Organizations:   . Attends Archivist Meetings:   Marland Kitchen Marital Status:   Intimate Partner Violence:   . Fear of Current or Ex-Partner:   . Emotionally Abused:   Marland Kitchen Physically Abused:   . Sexually Abused:     Outpatient Medications Prior to Visit  Medication Sig Dispense Refill  . ACCU-CHEK SOFTCLIX LANCETS lancets For testing once a day. Dx:E11.65 100 each 12  . ALPRAZolam (XANAX) 0.5 MG tablet Take 1 tablet (0.5 mg total) by mouth daily as needed for anxiety or sleep. 30 tablet 1  . AMBULATORY NON FORMULARY MEDICATION Medication Name: glucometer, lancets and strips to test once a day. Dx. Diabetes type 2. 90 days supply 1 Units PRN  . lansoprazole (PREVACID) 15 MG capsule Take 1 capsule by mouth  at bedtime 30 capsule 0  . PARoxetine (PAXIL) 40 MG tablet Take 1 tablet by mouth once daily 90 tablet 0  . liraglutide (VICTOZA) 18 MG/3ML SOPN Inject 0.3 mLs (1.8 mg total) into the skin daily. 18 mL 2  . SitaGLIPtin-MetFORMIN HCl (JANUMET XR) [phone] MG TB24 Take 1 tablet by mouth daily. 30 tablet 3   No facility-administered medications prior to visit.    Allergies  Allergen Reactions  . Amoxicillin Shortness Of Breath and Rash  . Codeine Other (See Comments)    Headache  . Vicodin [Hydrocodone-Acetaminophen]     ROS Review of Systems    Objective:    Physical Exam  Constitutional: She is oriented to person, place, and time. She appears well-developed and well-nourished.  HENT:  Head: Normocephalic and atraumatic.  Cardiovascular: Normal rate, regular rhythm and normal heart sounds.  Pulmonary/Chest: Effort normal and breath sounds normal.   Neurological: She is alert and oriented to person, place, and time.  Skin: Skin is warm and dry.  Cystic nodule on PIP of right 5th finger.  Also has a hard knot on the posterior heel.   Psychiatric: She has a normal mood and affect. Her behavior is normal.    BP 115/68   Pulse 89   Ht 5\' 9"  (1.753 m)   Wt 212 lb (96.2 kg)   LMP  (LMP Unknown)   SpO2 100%   BMI 31.31 kg/m  Wt Readings from Last 3 Encounters:  01/13/20 212 lb (96.2 kg)  10/25/19 222 lb 0.6 oz (100.7 kg)  06/06/19 215 lb (97.5 kg)     Health Maintenance Due  Topic Date Due  . PNEUMOCOCCAL POLYSACCHARIDE VACCINE AGE 66-64 HIGH RISK  Never done    There are no preventive care reminders to display for this patient.  Lab Results  Component Value Date   TSH 3.80 03/08/2018   Lab Results  Component Value Date   WBC 7.1 03/08/2018   HGB 13.2 03/08/2018   HCT 39.3 03/08/2018   MCV 75.3 (L) 03/08/2018   PLT 422 (H) 03/08/2018   Lab Results  Component Value Date   NA 138 10/25/2019   K 4.3 10/25/2019   CO2 25 10/25/2019   GLUCOSE 185 (H) 10/25/2019   BUN 14 10/25/2019   CREATININE 0.90 10/25/2019   BILITOT 0.4 10/25/2019   ALKPHOS 70 10/22/2016   AST 15 10/25/2019   ALT 22 10/25/2019   PROT 6.8 10/25/2019   ALBUMIN 4.4 10/22/2016   CALCIUM 9.5 10/25/2019   Lab Results  Component Value Date   CHOL 230 (H) 03/08/2018   Lab Results  Component Value Date   HDL 43 (L) 03/08/2018   Lab Results  Component Value Date   LDLCALC 155 (H) 03/08/2018   Lab Results  Component Value Date   TRIG 188 (H) 03/08/2018   Lab Results  Component Value Date   CHOLHDL 5.3 (H) 03/08/2018   Lab Results  Component Value Date   HGBA1C 7.1 (A) 01/13/2020      Assessment & Plan:   Problem List Items Addressed This Visit      Endocrine   Controlled type 2 diabetes mellitus without complication, without long-term current use of insulin ([place]) - Primary    Uncontrolled A1C over 7. Discussed workin gon diet  and exercise.  meds refilled.    Lab Results  Component Value Date   HGBA1C 7.1 (A) 01/13/2020         Relevant Medications   SitaGLIPtin-MetFORMIN HCl (  JANUMET XR) [phone] MG TB24   liraglutide (VICTOZA) 18 MG/3ML SOPN   Other Relevant Orders   POCT glycosylated hemoglobin (Hb A1C) (Completed)    Other Visit Diagnoses    Need for tetanus, diphtheria, and acellular pertussis (Tdap) vaccine in patient of adolescent age or older       Relevant Orders   Tdap vaccine greater than or equal to 7yo IM (Completed)   Pain of right heel       Relevant Orders   DG Foot Complete Right (Completed)   Synovial cyst of hand          Right heel pain  - will get xray . Consider cyst versus heel spur. Avoid pressure directly on the area. Consider podiatry referral if not improving.  Synovial cyst of right hand-gave reassurance of its not really bothering her that I would not recommend any further treatment but if it is becoming sore or tender or frequently bothersome will refer to Dr. Dianah Field for aspiration of  Tdap given today.    Meds ordered this encounter  Medications  . SitaGLIPtin-MetFORMIN HCl (JANUMET XR) [phone] MG TB24    Sig: Take 1 tablet by mouth daily.    Dispense:  90 tablet    Refill:  1  . liraglutide (VICTOZA) 18 MG/3ML SOPN    Sig: Inject 0.3 mLs (1.8 mg total) into the skin daily.    Dispense:  18 mL    Refill:  2    Follow-up: Return in about 3 months (around 04/14/2020) for Diabetes follow-up.    Beatrice Lecher, MD

## 2020-01-16 NOTE — Assessment & Plan Note (Signed)
Uncontrolled A1C over 7. Discussed workin gon diet and exercise.  meds refilled.    Lab Results  Component Value Date   HGBA1C 7.1 (A) 01/13/2020

## 2020-01-31 ENCOUNTER — Ambulatory Visit (INDEPENDENT_AMBULATORY_CARE_PROVIDER_SITE_OTHER): Payer: BLUE CROSS/BLUE SHIELD | Admitting: Nurse Practitioner

## 2020-01-31 ENCOUNTER — Encounter: Payer: Self-pay | Admitting: Nurse Practitioner

## 2020-01-31 VITALS — BP 133/91 | HR 97 | Temp 98.0°F | Ht 69.0 in | Wt 215.0 lb

## 2020-01-31 DIAGNOSIS — Z87440 Personal history of urinary (tract) infections: Secondary | ICD-10-CM

## 2020-01-31 DIAGNOSIS — N3090 Cystitis, unspecified without hematuria: Secondary | ICD-10-CM | POA: Diagnosis not present

## 2020-01-31 LAB — POCT URINALYSIS DIP (CLINITEK)
Bilirubin, UA: NEGATIVE
Blood, UA: NEGATIVE
Glucose, UA: NEGATIVE mg/dL
Ketones, POC UA: NEGATIVE mg/dL
Nitrite, UA: NEGATIVE
Spec Grav, UA: >=1.03 — AB (ref 1.010–1.025)
Urobilinogen, UA: 0.2 E.U./dL
pH, UA: 6 (ref 5.0–8.0)

## 2020-01-31 MED ORDER — NITROFURANTOIN MONOHYD MACRO 100 MG PO CAPS: 100.0000 mg | ORAL_CAPSULE | Freq: Two times a day (BID) | ORAL | 0 refills | Status: DC

## 2020-01-31 MED ORDER — NITROFURANTOIN MONOHYD MACRO 100 MG PO CAPS: ORAL_CAPSULE | ORAL | 3 refills | Status: DC

## 2020-01-31 MED ORDER — FLUCONAZOLE 150 MG PO TABS: 150.0000 mg | ORAL_TABLET | Freq: Every day | ORAL | 1 refills | Status: DC

## 2020-01-31 NOTE — Progress Notes (Signed)
Acute Office Visit  Subjective:    Patient ID: Jennifer Gray, female    DOB: 1966/04/27, 54 y.o.   MRN: [id number]  Chief Complaint  Patient presents with  . Dysuria    onset:1wk, frequency, decreased output, odor, chronic back pain, suprapubic pressure, tried OTC Azo with relief but once she stopped sx returned    HPI Patient is in today for symptoms of UTI that started approximately one week ago. She did take OTC AZO, which initially helped with symptoms, but they have returned and worsened. She does have a history of recurrent UTI symptoms after intercourse. She reports proper urinary hygiene measures including hydration, urination before and after intercourse, and wiping from front to back. She is frustrated with the repeated occurrence.   Dysuria: yes Blood in urine: no Frequency: yes Incontinence: no Feelings of incomplete emptying: yes Abdominal pressure: yes Bladder spasms: yes Abdominal pain: yes Back pain: yes- low back bilateral Abnormal color or odor of urine: yes Days of symptoms: 7 Fever: no Nausea: no Vomiting: no  Post-Menopausal: yes Dehydration: no New sexual partners: no Vaginal discharge: no Vaginal odor: no  Have you taken anything for this? OTC AZO Have you ever had these symptoms before? yes   Past Medical History:  Diagnosis Date  . Anxiety   . Anxiety     Past Surgical History:  Procedure Laterality Date  . BACK SURGERY    . BREAST SURGERY      Family History  Problem Relation Age of Onset  . Hypertension Mother   . Cancer Father   . Diabetes Other   . Heart disease Brother 69    Social History   Socioeconomic History  . Marital status: Married    Spouse name: Not on file  . Number of children: Not on file  . Years of education: Not on file  . Highest education level: Not on file  Occupational History  . Not on file  Tobacco Use  . Smoking status: Former Smoker    Years: 10.00    Quit date: 09/17/2006    Years since  quitting: 13.3  . Smokeless tobacco: Never Used  Substance and Sexual Activity  . Alcohol use: No  . Drug use: No  . Sexual activity: Not on file  Other Topics Concern  . Not on file  Social History Narrative  . Not on file   Social Determinants of Health   Financial Resource Strain:   . Difficulty of Paying Living Expenses:   Food Insecurity:   . Worried About Charity fundraiser in the Last Year:   . Arboriculturist in the Last Year:   Transportation Needs:   . Film/video editor (Medical):   Marland Kitchen Lack of Transportation (Non-Medical):   Physical Activity:   . Days of Exercise per Week:   . Minutes of Exercise per Session:   Stress:   . Feeling of Stress :   Social Connections:   . Frequency of Communication with Friends and Family:   . Frequency of Social Gatherings with Friends and Family:   . Attends Religious Services:   . Active Member of Clubs or Organizations:   . Attends Archivist Meetings:   Marland Kitchen Marital Status:   Intimate Partner Violence:   . Fear of Current or Ex-Partner:   . Emotionally Abused:   Marland Kitchen Physically Abused:   . Sexually Abused:     Outpatient Medications Prior to Visit  Medication Sig Dispense Refill  .  ACCU-CHEK SOFTCLIX LANCETS lancets For testing once a day. Dx:E11.65 100 each 12  . ALPRAZolam (XANAX) 0.5 MG tablet Take 1 tablet (0.5 mg total) by mouth daily as needed for anxiety or sleep. 30 tablet 1  . AMBULATORY NON FORMULARY MEDICATION Medication Name: glucometer, lancets and strips to test once a day. Dx. Diabetes type 2. 90 days supply 1 Units PRN  . lansoprazole (PREVACID) 15 MG capsule Take 1 capsule by mouth at bedtime 30 capsule 0  . liraglutide (VICTOZA) 18 MG/3ML SOPN Inject 0.3 mLs (1.8 mg total) into the skin daily. 18 mL 2  . PARoxetine (PAXIL) 40 MG tablet Take 1 tablet by mouth once daily 90 tablet 0  . SitaGLIPtin-MetFORMIN HCl (JANUMET XR) [phone] MG TB24 Take 1 tablet by mouth daily. 90 tablet 1   No  facility-administered medications prior to visit.    Allergies  Allergen Reactions  . Amoxicillin Shortness Of Breath and Rash  . Codeine Other (See Comments)    Headache  . Vicodin [Hydrocodone-Acetaminophen]     Review of Systems  Constitutional: Negative for appetite change, chills, fatigue and fever.  Respiratory: Negative for shortness of breath.   Cardiovascular: Negative for chest pain and palpitations.  Gastrointestinal: Positive for abdominal pain. Negative for abdominal distention, diarrhea, nausea and vomiting.  Genitourinary: Positive for decreased urine volume, dysuria, frequency, pelvic pain and urgency. Negative for flank pain, genital sores, hematuria, vaginal bleeding, vaginal discharge and vaginal pain.  Musculoskeletal: Positive for back pain.       Objective:    Physical Exam Vitals and nursing note reviewed.  Constitutional:      Appearance: Normal appearance.  HENT:     Head: Normocephalic.  Eyes:     Extraocular Movements: Extraocular movements intact.     Conjunctiva/sclera: Conjunctivae normal.     Pupils: Pupils are equal, round, and reactive to light.  Cardiovascular:     Rate and Rhythm: Normal rate and regular rhythm.     Pulses: Normal pulses.     Heart sounds: Normal heart sounds.  Pulmonary:     Effort: Pulmonary effort is normal.     Breath sounds: Normal breath sounds.  Abdominal:     General: Abdomen is flat. Bowel sounds are normal. There is distension.     Palpations: Abdomen is soft.     Tenderness: There is abdominal tenderness. There is no right CVA tenderness or left CVA tenderness.  Musculoskeletal:        General: Normal range of motion.     Cervical back: Normal range of motion.  Skin:    General: Skin is warm and dry.     Capillary Refill: Capillary refill takes less than 2 seconds.  Neurological:     General: No focal deficit present.     Mental Status: She is alert and oriented to person, place, and time.   Psychiatric:        Mood and Affect: Mood normal.        Behavior: Behavior normal.        Thought Content: Thought content normal.        Judgment: Judgment normal.     BP (!) 133/91   Pulse 97   Temp 98 F (36.7 C) (Oral)   Ht 5\' 9"  (1.753 m)   Wt 215 lb (97.5 kg)   LMP  (LMP Unknown)   SpO2 99%   BMI 31.75 kg/m  Wt Readings from Last 3 Encounters:  01/31/20 215 lb (97.5 kg)  01/13/20  212 lb (96.2 kg)  10/25/19 222 lb 0.6 oz (100.7 kg)    There are no preventive care reminders to display for this patient.  There are no preventive care reminders to display for this patient.   Lab Results  Component Value Date   TSH 3.80 03/08/2018   Lab Results  Component Value Date   WBC 7.1 03/08/2018   HGB 13.2 03/08/2018   HCT 39.3 03/08/2018   MCV 75.3 (L) 03/08/2018   PLT 422 (H) 03/08/2018   Lab Results  Component Value Date   NA 138 10/25/2019   K 4.3 10/25/2019   CO2 25 10/25/2019   GLUCOSE 185 (H) 10/25/2019   BUN 14 10/25/2019   CREATININE 0.90 10/25/2019   BILITOT 0.4 10/25/2019   ALKPHOS 70 10/22/2016   AST 15 10/25/2019   ALT 22 10/25/2019   PROT 6.8 10/25/2019   ALBUMIN 4.4 10/22/2016   CALCIUM 9.5 10/25/2019   Lab Results  Component Value Date   CHOL 230 (H) 03/08/2018   Lab Results  Component Value Date   HDL 43 (L) 03/08/2018   Lab Results  Component Value Date   LDLCALC 155 (H) 03/08/2018   Lab Results  Component Value Date   TRIG 188 (H) 03/08/2018   Lab Results  Component Value Date   CHOLHDL 5.3 (H) 03/08/2018   Lab Results  Component Value Date   HGBA1C 7.1 (A) 01/13/2020       Assessment & Plan:   1. Cystitis Symptoms and presentation consistent with acute cystitis. UA reveals small leukocytes present. Information provided on preventative measures and monitoring for worsening of symptoms. Patient frequently gets yeast infection after prolonged antibiotic use- will provide treatment for this.   PLAN: -Macrobid BID for 5  days  -Fluconazole prescribed for vaginal yeast infection that often accompanies antibiotic therapy for patient.  -Will send urine for culture  - POCT URINALYSIS DIP (CLINITEK) - Urine Culture - nitrofurantoin, macrocrystal-monohydrate, (MACROBID) 100 MG capsule; Take 1 capsule (100 mg total) by mouth 2 (two) times daily.  Dispense: 10 capsule; Refill: 0 - fluconazole (DIFLUCAN) 150 MG tablet; Take 1 tablet (150 mg total) by mouth daily.  Dispense: 1 tablet; Refill: 1  2. History of recurrent UTI (urinary tract infection) History of recurrent UTI symptoms after intercourse. Has not tried prophylactic treatment for this in the past, but is interested in this today.   PLAN: -Macrobid two doses 12 hours apart after intercourse for UTI prevention.  -Information provided on urinary hygiene.  - nitrofurantoin, macrocrystal-monohydrate, (MACROBID) 100 MG capsule; Take one tablet (100mg ) after intercourse and one tablet (100mg ) 12 hours later for UTI prevention.  Dispense: 30 capsule; Refill: 3  Follow-up if symptoms worsen or fail to improve.   Orma Render, NP

## 2020-01-31 NOTE — Patient Instructions (Addendum)
Urinary Tract Infection, Adult A urinary tract infection (UTI) is an infection of any part of the urinary tract. The urinary tract includes:  The kidneys.  The ureters.  The bladder.  The urethra. These organs make, store, and get rid of pee (urine) in the body. What are the causes? This is caused by germs (bacteria) in your genital area. These germs grow and cause swelling (inflammation) of your urinary tract. What increases the risk? You are more likely to develop this condition if:  You have a small, thin tube (catheter) to drain pee.  You cannot control when you pee or poop (incontinence).  You are female, and: ? You use these methods to prevent pregnancy:  A medicine that kills sperm (spermicide).  A device that blocks sperm (diaphragm). ? You have low levels of a female hormone (estrogen). ? You are pregnant.  You have genes that add to your risk.  You are sexually active.  You take antibiotic medicines.  You have trouble peeing because of: ? A prostate that is bigger than normal, if you are female. ? A blockage in the part of your body that drains pee from the bladder (urethra). ? A kidney stone. ? A nerve condition that affects your bladder (neurogenic bladder). ? Not getting enough to drink. ? Not peeing often enough.  You have other conditions, such as: ? Diabetes. ? A weak disease-fighting system (immune system). ? Sickle cell disease. ? Gout. ? Injury of the spine. What are the signs or symptoms? Symptoms of this condition include:  Needing to pee right away (urgently).  Peeing often.  Peeing small amounts often.  Pain or burning when peeing.  Blood in the pee.  Pee that smells bad or not like normal.  Trouble peeing.  Pee that is cloudy.  Fluid coming from the vagina, if you are female.  Pain in the belly or lower back. Other symptoms include:  Throwing up (vomiting).  No urge to eat.  Feeling mixed up (confused).  Being tired  and grouchy (irritable).  A fever.  Watery poop (diarrhea). How is this treated? This condition may be treated with:  Antibiotic medicine.  Other medicines.  Drinking enough water. Follow these instructions at home:  Medicines  Take over-the-counter and prescription medicines only as told by your doctor.  If you were prescribed an antibiotic medicine, take it as told by your doctor. Do not stop taking it even if you start to feel better. General instructions  Make sure you: ? Pee until your bladder is empty. ? Do not hold pee for a long time. ? Empty your bladder after sex. ? Wipe from front to back after pooping if you are a female. Use each tissue one time when you wipe.  Drink enough fluid to keep your pee pale yellow.  Keep all follow-up visits as told by your doctor. This is important. Contact a doctor if:  You do not get better after 1-2 days.  Your symptoms go away and then come back. Get help right away if:  You have very bad back pain.  You have very bad pain in your lower belly.  You have a fever.  You are sick to your stomach (nauseous).  You are throwing up. Summary  A urinary tract infection (UTI) is an infection of any part of the urinary tract.  This condition is caused by germs in your genital area.  There are many risk factors for a UTI. These include having a small, thin   tube to drain pee and not being able to control when you pee or poop.  Treatment includes antibiotic medicines for germs.  Drink enough fluid to keep your pee pale yellow. This information is not intended to replace advice given to you by your health care provider. Make sure you discuss any questions you have with your health care provider. Document Revised: 08/05/2018 Document Reviewed: 02/25/2018 Elsevier Patient Education  2020 [employer]  

## 2020-02-02 LAB — URINE CULTURE
MICRO NUMBER:: 10538338
SPECIMEN QUALITY:: ADEQUATE

## 2020-02-02 NOTE — Progress Notes (Signed)
Urine culture positive for e. Coli. Current antibiotic regimen is sensitive to this bacteria and should be effective for treatment.  If symptoms do not improve or come back, please let us know.

## 2020-04-08 ENCOUNTER — Other Ambulatory Visit: Payer: Self-pay | Admitting: Family Medicine

## 2020-04-17 ENCOUNTER — Other Ambulatory Visit: Payer: Self-pay

## 2020-04-17 ENCOUNTER — Encounter: Payer: Self-pay | Admitting: Family Medicine

## 2020-04-17 ENCOUNTER — Ambulatory Visit (INDEPENDENT_AMBULATORY_CARE_PROVIDER_SITE_OTHER): Payer: BLUE CROSS/BLUE SHIELD | Admitting: Family Medicine

## 2020-04-17 VITALS — BP 122/73 | HR 96 | Ht 69.0 in | Wt 210.0 lb

## 2020-04-17 DIAGNOSIS — E119 Type 2 diabetes mellitus without complications: Secondary | ICD-10-CM

## 2020-04-17 DIAGNOSIS — Z1211 Encounter for screening for malignant neoplasm of colon: Secondary | ICD-10-CM

## 2020-04-17 DIAGNOSIS — Z23 Encounter for immunization: Secondary | ICD-10-CM

## 2020-04-17 DIAGNOSIS — F4010 Social phobia, unspecified: Secondary | ICD-10-CM

## 2020-04-17 DIAGNOSIS — Z1231 Encounter for screening mammogram for malignant neoplasm of breast: Secondary | ICD-10-CM

## 2020-04-17 LAB — POCT GLYCOSYLATED HEMOGLOBIN (HGB A1C): Hemoglobin A1C: 6.8 % — AB (ref 4.0–5.6)

## 2020-04-17 NOTE — Assessment & Plan Note (Signed)
She is doing a great job.  Hemoglobin A1c 6.8 today which is down from 7.1.  Just encouraged her to continue to work on dietary choices, portion control and eating a low-carb low sugar diet.  Recommend follow-up in 3 to 4 months.

## 2020-04-17 NOTE — Progress Notes (Signed)
Established Patient Office Visit  Subjective:  Patient ID: Jennifer Gray, female    DOB: Jun 06, 1966  Age: 54 y.o. MRN: [id number]  CC:  Chief Complaint  Patient presents with  . Diabetes    HPI Jennifer Gray presents for   Diabetes - no hypoglycemic events. No wounds or sores that are not healing well. No increased thirst or urination. Checking glucose at home. Taking medications as prescribed without any side effects.  F/U social anxiety - doing well on Paxil. Happy with her current regimen. Doesn't feel any changes are needed.   Rarely uses her xanax.    PHQ9 SCORE ONLY 04/17/2020 01/13/2020 10/25/2019  PHQ-9 Total Score 2 6 10     She may have to change providers bc of her insurance.   Recently seen for cysttiis and recurrence. Has started on prophylaxis.    Past Medical History:  Diagnosis Date  . Anxiety   . Anxiety     Past Surgical History:  Procedure Laterality Date  . BACK SURGERY    . BREAST SURGERY      Family History  Problem Relation Age of Onset  . Hypertension Mother   . Cancer Father   . Diabetes Other   . Heart disease Brother 3    Social History   Socioeconomic History  . Marital status: Married    Spouse name: Not on file  . Number of children: Not on file  . Years of education: Not on file  . Highest education level: Not on file  Occupational History  . Not on file  Tobacco Use  . Smoking status: Former Smoker    Years: 10.00    Quit date: 09/17/2006    Years since quitting: 13.5  . Smokeless tobacco: Never Used  Substance and Sexual Activity  . Alcohol use: No  . Drug use: No  . Sexual activity: Not on file  Other Topics Concern  . Not on file  Social History Narrative  . Not on file   Social Determinants of Health   Financial Resource Strain:   . Difficulty of Paying Living Expenses:   Food Insecurity:   . Worried About Charity fundraiser in the Last Year:   . Arboriculturist in the Last Year:   Transportation  Needs:   . Film/video editor (Medical):   Marland Kitchen Lack of Transportation (Non-Medical):   Physical Activity:   . Days of Exercise per Week:   . Minutes of Exercise per Session:   Stress:   . Feeling of Stress :   Social Connections:   . Frequency of Communication with Friends and Family:   . Frequency of Social Gatherings with Friends and Family:   . Attends Religious Services:   . Active Member of Clubs or Organizations:   . Attends Archivist Meetings:   Marland Kitchen Marital Status:   Intimate Partner Violence:   . Fear of Current or Ex-Partner:   . Emotionally Abused:   Marland Kitchen Physically Abused:   . Sexually Abused:     Outpatient Medications Prior to Visit  Medication Sig Dispense Refill  . ACCU-CHEK SOFTCLIX LANCETS lancets For testing once a day. Dx:E11.65 100 each 12  . ALPRAZolam (XANAX) 0.5 MG tablet Take 1 tablet (0.5 mg total) by mouth daily as needed for anxiety or sleep. 30 tablet 1  . AMBULATORY NON FORMULARY MEDICATION Medication Name: glucometer, lancets and strips to test once a day. Dx. Diabetes type 2. 90 days supply 1 Units PRN  .  lansoprazole (PREVACID) 15 MG capsule Take 1 capsule by mouth at bedtime 30 capsule 0  . liraglutide (VICTOZA) 18 MG/3ML SOPN Inject 0.3 mLs (1.8 mg total) into the skin daily. 18 mL 2  . nitrofurantoin, macrocrystal-monohydrate, (MACROBID) 100 MG capsule Take one tablet (100mg ) after intercourse and one tablet (100mg ) 12 hours later for UTI prevention. 30 capsule 3  . PARoxetine (PAXIL) 40 MG tablet Take 1 tablet by mouth once daily 90 tablet 0  . SitaGLIPtin-MetFORMIN HCl (JANUMET XR) [phone] MG TB24 Take 1 tablet by mouth daily. 90 tablet 1  . fluconazole (DIFLUCAN) 150 MG tablet Take 1 tablet (150 mg total) by mouth daily. 1 tablet 1  . nitrofurantoin, macrocrystal-monohydrate, (MACROBID) 100 MG capsule Take 1 capsule (100 mg total) by mouth 2 (two) times daily. 10 capsule 0   No facility-administered medications prior to visit.     Allergies  Allergen Reactions  . Amoxicillin Shortness Of Breath and Rash  . Codeine Other (See Comments)    Headache  . Vicodin [Hydrocodone-Acetaminophen]     ROS Review of Systems    Objective:    Physical Exam  BP 122/73   Pulse 96   Ht 5\' 9"  (1.753 m)   Wt 210 lb (95.3 kg)   LMP  (LMP Unknown)   SpO2 100%   BMI 31.01 kg/m  Wt Readings from Last 3 Encounters:  04/17/20 210 lb (95.3 kg)  01/31/20 215 lb (97.5 kg)  01/13/20 212 lb (96.2 kg)     Health Maintenance Due  Topic Date Due  . Hepatitis C Screening  Never done  . PNEUMOCOCCAL POLYSACCHARIDE VACCINE AGE 6-64 HIGH RISK  Never done  . MAMMOGRAM  11/15/2016    There are no preventive care reminders to display for this patient.  Lab Results  Component Value Date   TSH 3.80 03/08/2018   Lab Results  Component Value Date   WBC 7.1 03/08/2018   HGB 13.2 03/08/2018   HCT 39.3 03/08/2018   MCV 75.3 (L) 03/08/2018   PLT 422 (H) 03/08/2018   Lab Results  Component Value Date   NA 138 10/25/2019   K 4.3 10/25/2019   CO2 25 10/25/2019   GLUCOSE 185 (H) 10/25/2019   BUN 14 10/25/2019   CREATININE 0.90 10/25/2019   BILITOT 0.4 10/25/2019   ALKPHOS 70 10/22/2016   AST 15 10/25/2019   ALT 22 10/25/2019   PROT 6.8 10/25/2019   ALBUMIN 4.4 10/22/2016   CALCIUM 9.5 10/25/2019   Lab Results  Component Value Date   CHOL 230 (H) 03/08/2018   Lab Results  Component Value Date   HDL 43 (L) 03/08/2018   Lab Results  Component Value Date   LDLCALC 155 (H) 03/08/2018   Lab Results  Component Value Date   TRIG 188 (H) 03/08/2018   Lab Results  Component Value Date   CHOLHDL 5.3 (H) 03/08/2018   Lab Results  Component Value Date   HGBA1C 6.8 (A) 04/17/2020      Assessment & Plan:   Problem List Items Addressed This Visit      Endocrine   Controlled type 2 diabetes mellitus without complication, without long-term current use of insulin ([place]) - Primary    She is doing a great job.   Hemoglobin A1c 6.8 today which is down from 7.1.  Just encouraged her to continue to work on dietary choices, portion control and eating a low-carb low sugar diet.  Recommend follow-up in 3 to 4 months.  Relevant Orders   POCT glycosylated hemoglobin (Hb A1C) (Completed)     Other   Social anxiety disorder    Doing well on current regimen. No changes.  F/U in 4 mo       Other Visit Diagnoses    Need for immunization against influenza       Relevant Orders   Flu Vaccine QUAD 36+ mos IM (Completed)   Screening mammogram, encounter for       Relevant Orders   MM 3D [place] for colon cancer       Relevant Orders   Cologuard     She is interested in doing Cologuard for colon Cancer screening.    Mammo ordered at [facility] imaging.     No orders of the defined types were placed in this encounter.   Follow-up: Return in about 4 months (around 08/17/2020) for Diabetes follow-up.    Beatrice Lecher, MD

## 2020-04-17 NOTE — Assessment & Plan Note (Signed)
Doing well on current regimen. No changes.  F/U in 4 mo

## 2020-05-02 ENCOUNTER — Other Ambulatory Visit: Payer: Self-pay

## 2020-05-02 ENCOUNTER — Ambulatory Visit
Admission: RE | Admit: 2020-05-02 | Discharge: 2020-05-02 | Disposition: A | Discharge status: Home / Self Care | Payer: BLUE CROSS/BLUE SHIELD | Source: Ambulatory Visit | Attending: Family Medicine | Admitting: Family Medicine

## 2020-05-02 DIAGNOSIS — Z1231 Encounter for screening mammogram for malignant neoplasm of breast: Secondary | ICD-10-CM

## 2020-07-16 ENCOUNTER — Other Ambulatory Visit: Payer: Self-pay | Admitting: Family Medicine

## 2020-08-31 IMAGING — DX DG FOOT COMPLETE 3+V*R*
3 series · 3 of 3 positions shown · non-contrast
Comparison: None.

CLINICAL DATA: Bump on heel close to the ankle. No known injury.
Heel pain.

EXAM:
RIGHT FOOT COMPLETE - 3+ VIEW

[foot ap]
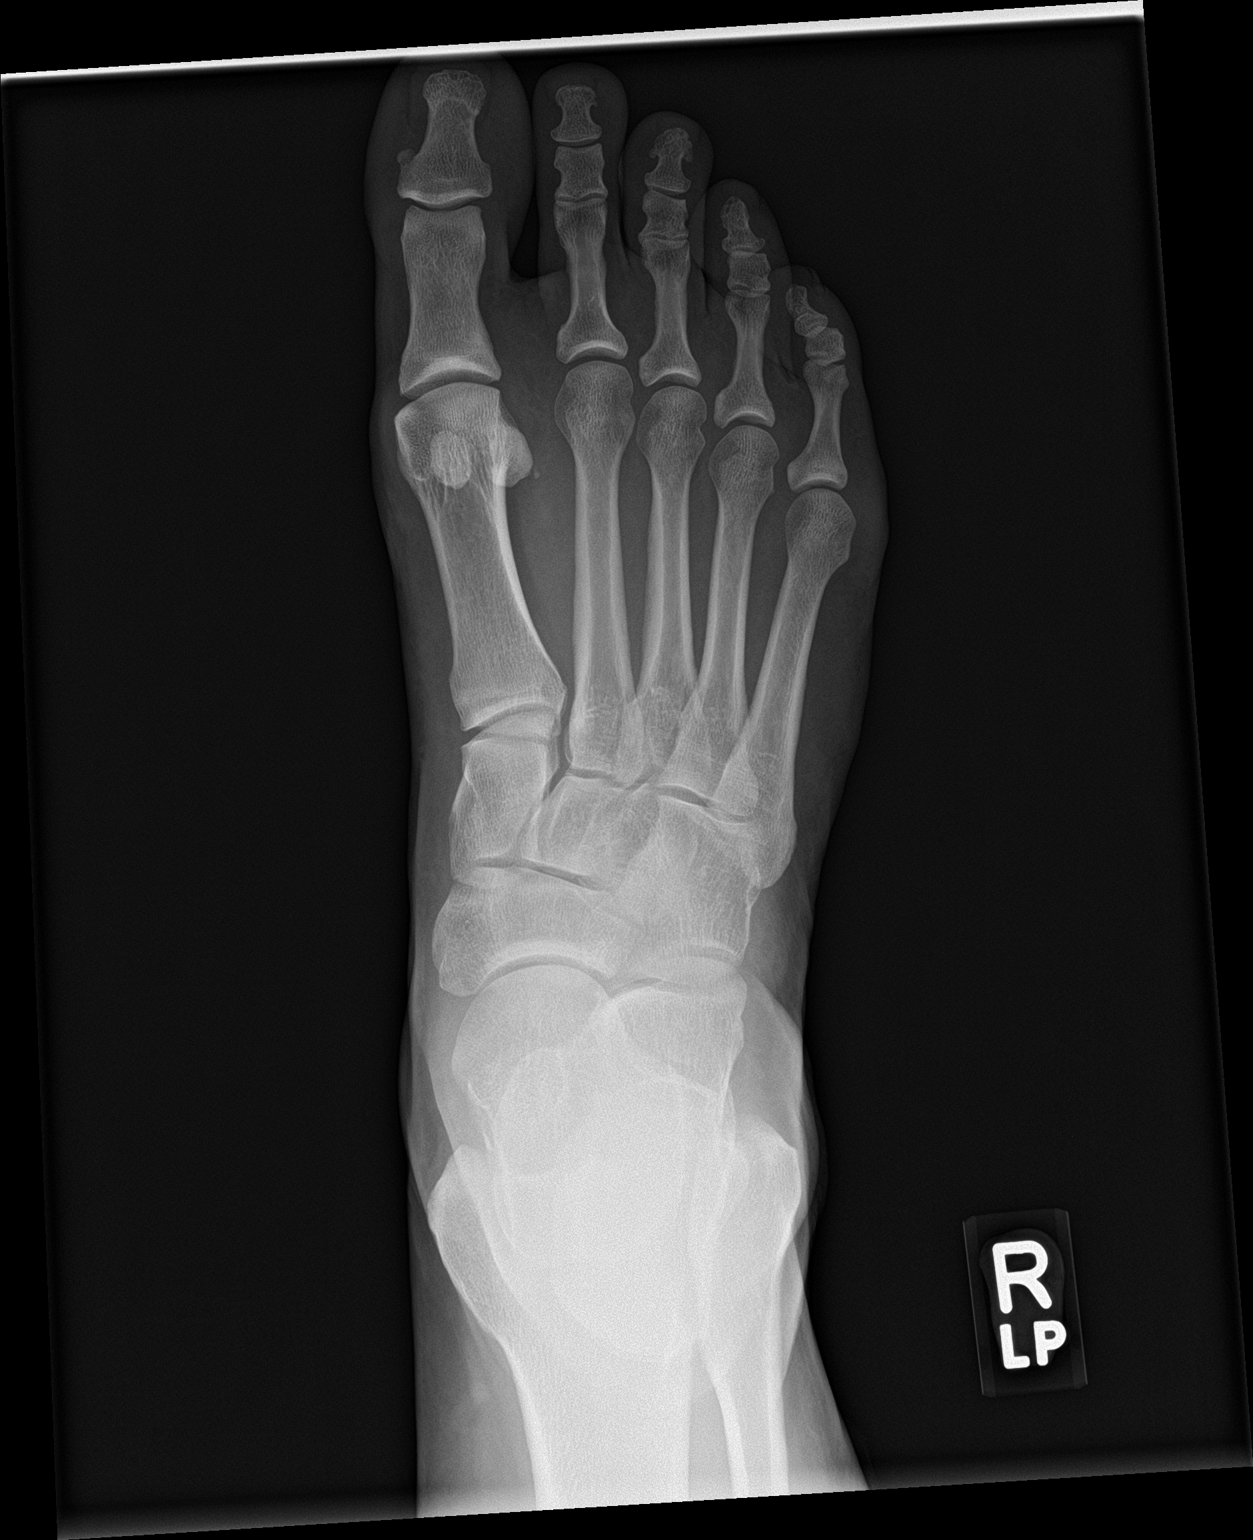

[foot obl]
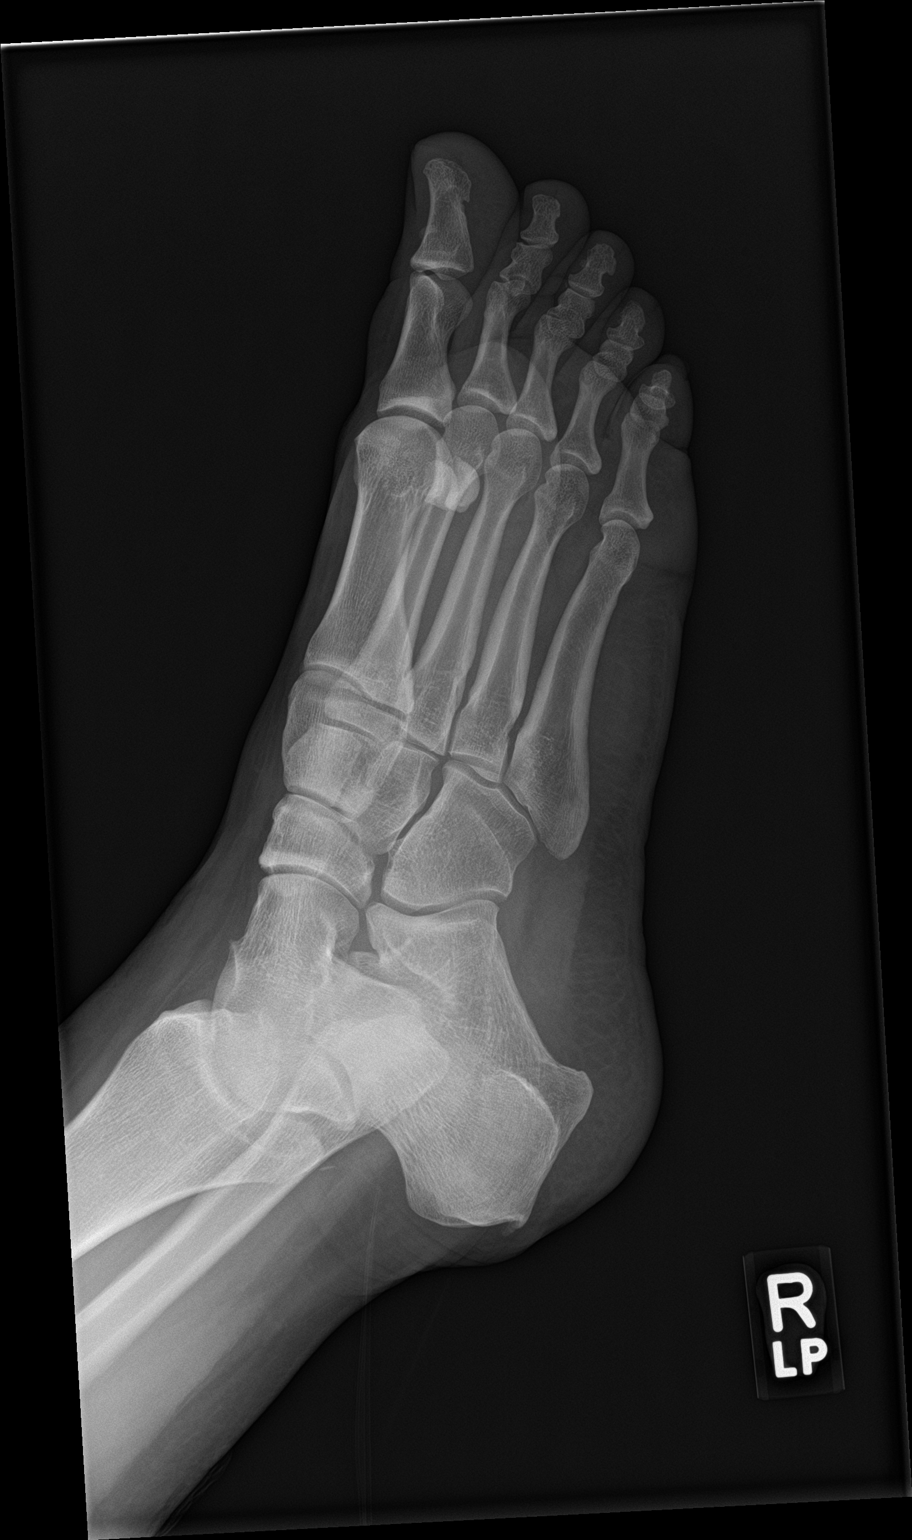

[foot lat]
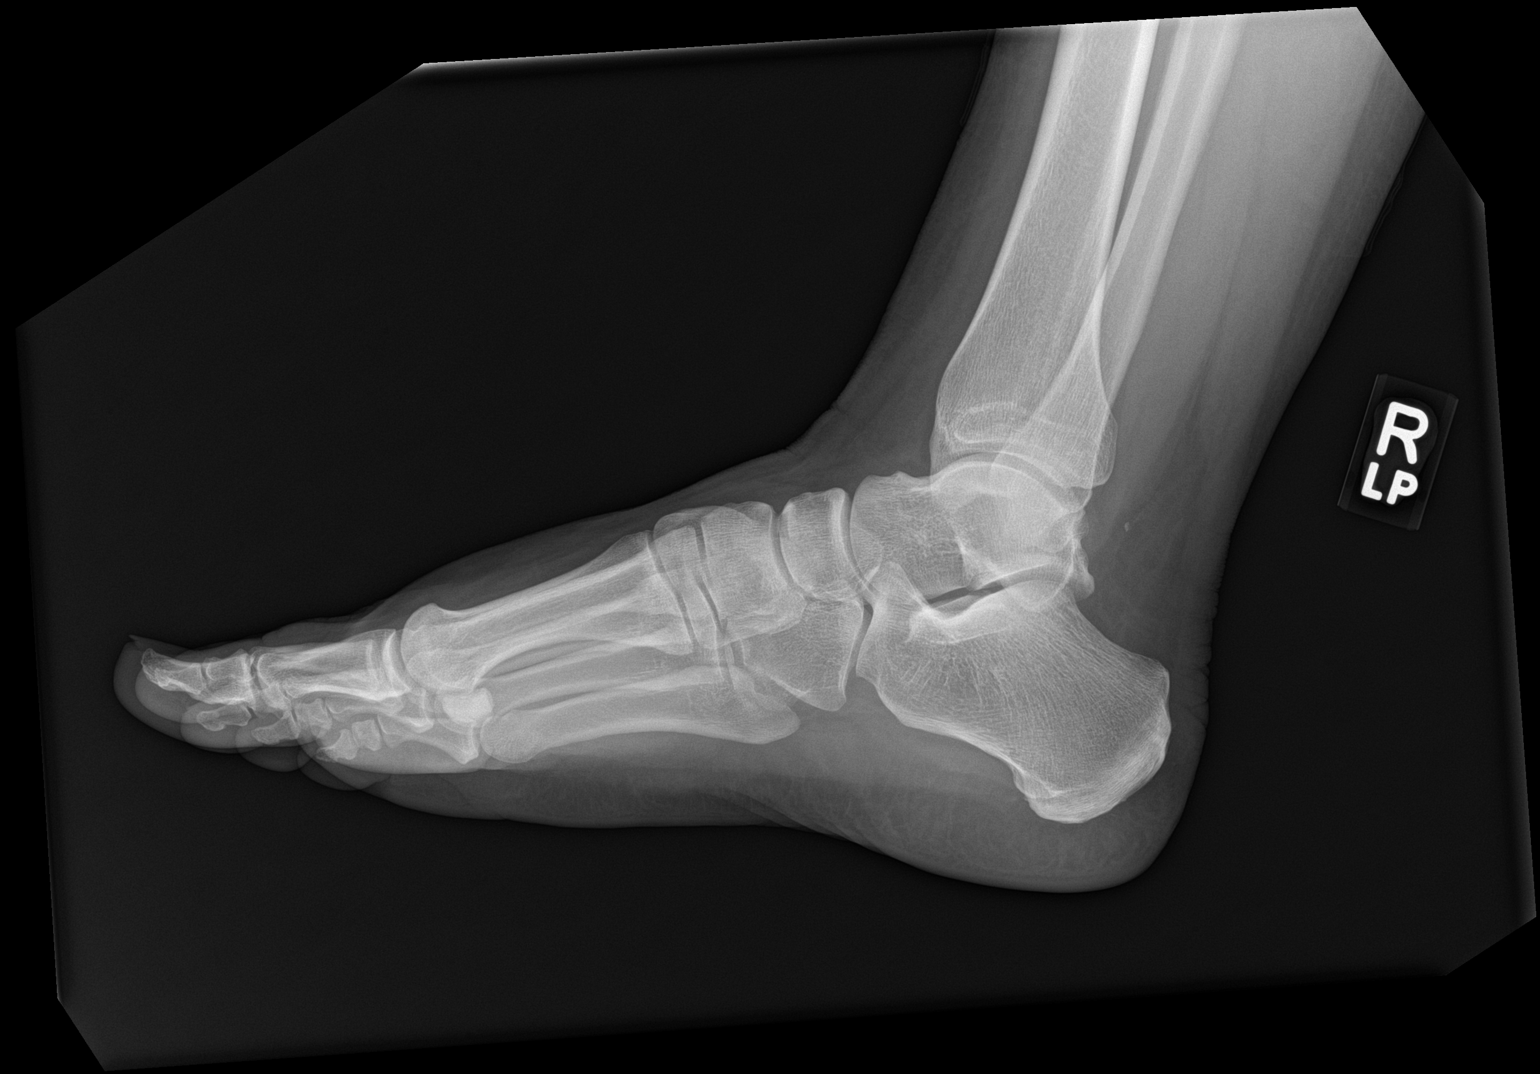

[3 of 3 positions shown; findings below may reference images not displayed]

FINDINGS: No fracture or bone lesion.

Joints are normally spaced and aligned.  No arthropathic changes.

Soft tissues are unremarkable.
IMPRESSION: Negative.

## 2020-09-13 ENCOUNTER — Encounter: Payer: Self-pay | Admitting: Family Medicine

## 2020-09-13 ENCOUNTER — Telehealth (INDEPENDENT_AMBULATORY_CARE_PROVIDER_SITE_OTHER): Payer: HRSA Program | Admitting: Family Medicine

## 2020-09-13 DIAGNOSIS — E119 Type 2 diabetes mellitus without complications: Secondary | ICD-10-CM

## 2020-09-13 DIAGNOSIS — K2100 Gastro-esophageal reflux disease with esophagitis, without bleeding: Secondary | ICD-10-CM | POA: Diagnosis not present

## 2020-09-13 DIAGNOSIS — K219 Gastro-esophageal reflux disease without esophagitis: Secondary | ICD-10-CM | POA: Insufficient documentation

## 2020-09-13 DIAGNOSIS — U071 COVID-19: Secondary | ICD-10-CM | POA: Diagnosis not present

## 2020-09-13 MED ORDER — VICTOZA 18 MG/3ML ~~LOC~~ SOPN
1.8000 mg | PEN_INJECTOR | Freq: Every day | SUBCUTANEOUS | 2 refills | Status: DC
Start: 2020-09-13 — End: 2020-11-01

## 2020-09-13 MED ORDER — BUDESONIDE-FORMOTEROL FUMARATE 160-4.5 MCG/ACT IN AERO: 2.0000 | INHALATION_SPRAY | Freq: Two times a day (BID) | RESPIRATORY_TRACT | 0 refills | Status: DC

## 2020-09-13 MED ORDER — LANSOPRAZOLE 15 MG PO CPDR: 15.0000 mg | DELAYED_RELEASE_CAPSULE | Freq: Every day | ORAL | 1 refills | Status: DC

## 2020-09-13 MED ORDER — ALBUTEROL SULFATE HFA 108 (90 BASE) MCG/ACT IN AERS: 2.0000 | INHALATION_SPRAY | Freq: Four times a day (QID) | RESPIRATORY_TRACT | 0 refills | Status: DC | PRN

## 2020-09-13 NOTE — Assessment & Plan Note (Signed)
Due for A1c.  But she is about to run out of her medication.  Refill sent to pharmacy and encouraged her to follow-up later this month when she is feeling better so that we can do a full diabetic checkup.  She does have health coverage now.

## 2020-09-13 NOTE — Assessment & Plan Note (Signed)
Prescription refill sent for her PPI.

## 2020-09-13 NOTE — Progress Notes (Signed)
Virtual Visit via Video Note  I connected with Jennifer Gray on 09/13/20 at  4:20 PM EST by a video enabled telemedicine application and verified that I am speaking with the correct person using two identifiers.   I discussed the limitations of evaluation and management by telemedicine and the availability of in person appointments. The patient expressed understanding and agreed to proceed.  Patient location: at home  Provider location: in office  Subjective:    CC: Cough   HPI: Symptoms started on Sunday.  + sick contacts.  Tested positive on yesterday with a rapid home test. + dry cough with gagging.  She reports that it feels like its in her chest and would like something to break this up. Today eyes are watering and feeling congested. No ST.  + nausea. + chills. She has been taking IBU/tylenol/and robitussin. She is not vaccinated. She denies f/s/v/d/body ache. SOB with activity.    Past medical history, Surgical history, Family history not pertinant except as noted below, Social history, Allergies, and medications have been entered into the medical record, reviewed, and corrections made.   Review of Systems: No fevers, chills, night sweats, weight loss, chest pain, or shortness of breath.   Objective:    General: Speaking clearly in complete sentences without any shortness of breath.  Alert and oriented x3.  Normal judgment. No apparent acute distress.    Impression and Recommendations:    GERD (gastroesophageal reflux disease) Prescription refill sent for her PPI.  Controlled type 2 diabetes mellitus without complication, without long-term current use of insulin ([place]) Due for A1c.  But she is about to run out of her medication.  Refill sent to pharmacy and encouraged her to follow-up later this month when she is feeling better so that we can do a full diabetic checkup.  She does have health coverage now.  COVID-19-discussed symptomatic care keeping up oral fluids which  she has been doing try to sleep on stomach if at all possible.  Okay to use over-the-counter cough and cold medication.  Some evidence for vitamin D, zinc and vitamin C so certainly she could try those if she would like.  Also recommend using a purifier since her cough seems to be more dry and feels like she is having a more dry throat.  She reports that her oxygen level has dropped around 94%.  We discussed warning signs and symptoms and when to go to the emergency department.  If her oxygen is dropping below 90% especially at rest then please give [place] a call immediately or go to the emergency room.  Did send over an inhaler for her to use as needed if she just feels like her chest is tight.   Meds ordered this encounter  Medications  . budesonide-formoterol (SYMBICORT) 160-4.5 MCG/ACT inhaler    Sig: Inhale 2 puffs into the lungs in the morning and at bedtime.    Dispense:  1 each    Refill:  0  . albuterol (VENTOLIN HFA) 108 (90 Base) MCG/ACT inhaler    Sig: Inhale 2 puffs into the lungs every 6 (six) hours as needed for wheezing or shortness of breath.    Dispense:  8 g    Refill:  0  . lansoprazole (PREVACID) 15 MG capsule    Sig: Take 1 capsule (15 mg total) by mouth at bedtime.    Dispense:  90 capsule    Refill:  1  . liraglutide (VICTOZA) 18 MG/3ML SOPN    Sig: Inject  1.8 mg into the skin daily.    Dispense:  18 mL    Refill:  2     I discussed the assessment and treatment plan with the patient. The patient was provided an opportunity to ask questions and all were answered. The patient agreed with the plan and demonstrated an understanding of the instructions.   The patient was advised to call back or seek an in-person evaluation if the symptoms worsen or if the condition fails to improve as anticipated.   Beatrice Lecher, MD

## 2020-09-13 NOTE — Progress Notes (Signed)
Tested positive on yesterday with a rapid home test.   She reports that it feels like its in her chest and would like something to break this up.  She has been taking IBU/tylenol/and robitussin.  She denies f/s/c/n/v/d/body ache

## 2020-09-17 ENCOUNTER — Telehealth: Payer: BLUE CROSS/BLUE SHIELD | Admitting: Family Medicine

## 2020-09-21 ENCOUNTER — Emergency Department (HOSPITAL_BASED_OUTPATIENT_CLINIC_OR_DEPARTMENT_OTHER): Payer: 59

## 2020-09-21 ENCOUNTER — Other Ambulatory Visit: Payer: Self-pay

## 2020-09-21 ENCOUNTER — Encounter (HOSPITAL_BASED_OUTPATIENT_CLINIC_OR_DEPARTMENT_OTHER): Payer: Self-pay | Admitting: *Deleted

## 2020-09-21 ENCOUNTER — Telehealth: Payer: Self-pay

## 2020-09-21 ENCOUNTER — Emergency Department (HOSPITAL_BASED_OUTPATIENT_CLINIC_OR_DEPARTMENT_OTHER)
Admission: EM | Admit: 2020-09-21 | Discharge: 2020-09-21 | Disposition: A | Discharge status: Home / Self Care | Payer: 59 | Attending: Emergency Medicine | Admitting: Emergency Medicine

## 2020-09-21 DIAGNOSIS — E119 Type 2 diabetes mellitus without complications: Secondary | ICD-10-CM | POA: Diagnosis not present

## 2020-09-21 DIAGNOSIS — R000 Tachycardia, unspecified: Secondary | ICD-10-CM | POA: Diagnosis not present

## 2020-09-21 DIAGNOSIS — R0602 Shortness of breath: Secondary | ICD-10-CM | POA: Diagnosis not present

## 2020-09-21 DIAGNOSIS — U099 Post covid-19 condition, unspecified: Secondary | ICD-10-CM | POA: Insufficient documentation

## 2020-09-21 DIAGNOSIS — Z87891 Personal history of nicotine dependence: Secondary | ICD-10-CM | POA: Insufficient documentation

## 2020-09-21 DIAGNOSIS — U071 COVID-19: Secondary | ICD-10-CM

## 2020-09-21 LAB — BASIC METABOLIC PANEL
Anion gap: 13 (ref 5–15)
BUN: 14 mg/dL (ref 6–20)
CO2: 19 mmol/L — ABNORMAL LOW (ref 22–32)
Calcium: 8.9 mg/dL (ref 8.9–10.3)
Chloride: 101 mmol/L (ref 98–111)
Creatinine, Ser: 0.93 mg/dL (ref 0.44–1.00)
GFR, Estimated: >60 mL/min (ref >60)
Glucose, Bld: 216 mg/dL — ABNORMAL HIGH (ref 70–99)
Potassium: 4.3 mmol/L (ref 3.5–5.1)
Sodium: 133 mmol/L — ABNORMAL LOW (ref 135–145)

## 2020-09-21 LAB — CBC WITH DIFFERENTIAL/PLATELET
Abs Immature Granulocytes: 0.04 10*3/uL (ref 0.00–0.07)
Basophils Absolute: 0 10*3/uL (ref 0.0–0.1)
Basophils Relative: 0 %
Eosinophils Absolute: 0 10*3/uL (ref 0.0–0.5)
Eosinophils Relative: 0 %
HCT: 40.7 % (ref 36.0–46.0)
Hemoglobin: 13.1 g/dL (ref 12.0–15.0)
Immature Granulocytes: 1 %
Lymphocytes Relative: 11 %
Lymphs Abs: 1 10*3/uL (ref 0.7–4.0)
MCH: 25.3 pg — ABNORMAL LOW (ref 26.0–34.0)
MCHC: 32.2 g/dL (ref 30.0–36.0)
MCV: 78.6 fL — ABNORMAL LOW (ref 80.0–100.0)
Monocytes Absolute: 0.6 10*3/uL (ref 0.1–1.0)
Monocytes Relative: 7 %
Neutro Abs: 7 10*3/uL (ref 1.7–7.7)
Neutrophils Relative %: 81 %
Platelets: 373 10*3/uL (ref 150–400)
RBC: 5.18 MIL/uL — ABNORMAL HIGH (ref 3.87–5.11)
RDW: 14.2 % (ref 11.5–15.5)
WBC: 8.7 10*3/uL (ref 4.0–10.5)
nRBC: 0 % (ref 0.0–0.2)

## 2020-09-21 LAB — D-DIMER, QUANTITATIVE: D-Dimer, Quant: 0.63 ug/mL-FEU — ABNORMAL HIGH (ref 0.00–0.50)

## 2020-09-21 LAB — TROPONIN I (HIGH SENSITIVITY): Troponin I (High Sensitivity): <2 ng/L (ref <18)

## 2020-09-21 MED ORDER — IOHEXOL 350 MG/ML SOLN
100.0000 mL | Freq: Once | INTRAVENOUS | Status: AC
  Administered 2020-09-21: 100 mL via INTRAVENOUS

## 2020-09-21 MED ORDER — ONDANSETRON HCL 4 MG/2ML IJ SOLN
4.0000 mg | Freq: Once | INTRAMUSCULAR | Status: AC
  Administered 2020-09-21: 4 mg via INTRAVENOUS
  Filled 2020-09-21: qty 2

## 2020-09-21 MED ORDER — LORAZEPAM 2 MG/ML IJ SOLN
1.0000 mg | Freq: Once | INTRAMUSCULAR | Status: AC
  Administered 2020-09-21: 1 mg via INTRAVENOUS
  Filled 2020-09-21: qty 1

## 2020-09-21 MED ORDER — SODIUM CHLORIDE 0.9 % IV BOLUS
1000.0000 mL | Freq: Once | INTRAVENOUS | Status: AC
  Administered 2020-09-21: 1000 mL via INTRAVENOUS

## 2020-09-21 NOTE — ED Triage Notes (Signed)
Covid positive a week ago. Here with SOB. She has a hx of anxiety.

## 2020-09-21 NOTE — Telephone Encounter (Signed)
I agree.   Jennifer Lecher, MD

## 2020-09-21 NOTE — ED Notes (Signed)
Pt reports she is feeling anxious and needs something for anxiety, PA made aware. Pt placed on bedside commode and given wipe and cup for urine sample

## 2020-09-21 NOTE — Discharge Instructions (Signed)
Follow-up with your primary care doctor.  Return to ER for any worsening of your breathing.

## 2020-09-21 NOTE — Telephone Encounter (Signed)
Jennifer Gray is having shortness of breath and palpitations. She sounded very out of breath while on the phone. I advised she will need to have her husband take her to the ED ASAP.

## 2020-09-21 NOTE — ED Provider Notes (Addendum)
[facility] EMERGENCY DEPARTMENT Provider Note   CSN: [id number] Arrival date & time: 09/21/20  1612     History Chief Complaint  Patient presents with  . Covid Positive  . Shortness of Breath    Jennifer Gray is a 55 y.o. female.  Patient presents to the emergency department for shortness of breath, chest tightness and anxiety.  Patient tested positive for COVID on 1/12 and symptoms began on or around 09/09/2020.  Symptoms have become a bit worse.  She states that she took Xanax for anxiety and palpitations prior to arrival but this did not help.  She has had persistent GI symptoms including nausea without vomiting and watery diarrhea that has become worse over the past day or 2.  No urinary symptoms reported.  Onset of symptoms was gradual.  Course is constant.        Past Medical History:  Diagnosis Date  . Anxiety   . Anxiety     Patient Active Problem List   Diagnosis Date Noted  . GERD (gastroesophageal reflux disease) 09/13/2020  . OSA (obstructive sleep apnea) 06/10/2019  . Fatty liver 05/04/2019  . Acne 09/17/2018  . Controlled type 2 diabetes mellitus without complication, without long-term current use of insulin ([place]) 05/26/2016  . Chronic idiopathic constipation 02/13/2015  . Social anxiety disorder 08/01/2013  . Radiculitis of left cervical region 09/30/2012    Past Surgical History:  Procedure Laterality Date  . BACK SURGERY    . BREAST SURGERY       OB History   No obstetric history on file.     Family History  Problem Relation Age of Onset  . Hypertension Mother   . Cancer Father   . Diabetes Other   . Heart disease Brother 6    Social History   Tobacco Use  . Smoking status: Former Smoker    Years: 10.00    Quit date: 09/17/2006    Years since quitting: 14.0  . Smokeless tobacco: Never Used  Substance Use Topics  . Alcohol use: No  . Drug use: No    Home Medications Prior to Admission medications   Medication Sig  Start Date End Date Taking? Authorizing Provider  ACCU-CHEK SOFTCLIX LANCETS lancets For testing once a day. Dx:E11.65 04/08/18   Hali Marry, MD  albuterol (VENTOLIN HFA) 108 (90 Base) MCG/ACT inhaler Inhale 2 puffs into the lungs every 6 (six) hours as needed for wheezing or shortness of breath. 09/13/20   Hali Marry, MD  ALPRAZolam Duanne Moron) 0.5 MG tablet Take 1 tablet (0.5 mg total) by mouth daily as needed for anxiety or sleep. 10/26/19   Early, Coralee Pesa, NP  AMBULATORY NON FORMULARY MEDICATION Medication Name: glucometer, lancets and strips to test once a day. Dx. Diabetes type 2. 90 days supply 05/26/16   Hali Marry, MD  budesonide-formoterol [facility]) 160-4.5 MCG/ACT inhaler Inhale 2 puffs into the lungs in the morning and at bedtime. 09/13/20   Hali Marry, MD  lansoprazole (PREVACID) 15 MG capsule Take 1 capsule (15 mg total) by mouth at bedtime. 09/13/20   Hali Marry, MD  liraglutide (VICTOZA) 18 MG/3ML SOPN Inject 1.8 mg into the skin daily. 09/13/20   Hali Marry, MD  PARoxetine (PAXIL) 40 MG tablet Take 1 tablet by mouth once daily 07/17/20   Hali Marry, MD  SitaGLIPtin-MetFORMIN HCl (JANUMET XR) [phone] MG TB24 Take 1 tablet by mouth daily. 01/13/20   Hali Marry, MD  Allergies    Amoxicillin, Codeine, and Vicodin [hydrocodone-acetaminophen]  Review of Systems   Review of Systems  Constitutional: Negative for fever.  HENT: Negative for rhinorrhea and sore throat.   Eyes: Negative for redness.  Respiratory: Positive for chest tightness and shortness of breath. Negative for cough.   Cardiovascular: Negative for chest pain.  Gastrointestinal: Positive for diarrhea and nausea. Negative for abdominal pain and vomiting.  Genitourinary: Negative for dysuria, frequency, hematuria and urgency.  Musculoskeletal: Negative for myalgias.  Skin: Negative for rash.  Neurological: Negative for headaches.   Psychiatric/Behavioral: The patient is nervous/anxious.     Physical Exam Updated Vital Signs BP (!) 176/98   Pulse (!) 118   Temp 98.1 F (36.7 C) (Oral)   Resp (!) 28   Ht 5\' 9"  (1.753 m)   Wt 97.5 kg   LMP  (LMP Unknown)   SpO2 98%   BMI 31.75 kg/m   Physical Exam Vitals and nursing note reviewed.  Constitutional:      General: She is not in acute distress.    Appearance: She is well-developed.  HENT:     Head: Normocephalic and atraumatic.     Right Ear: External ear normal.     Left Ear: External ear normal.     Nose: Nose normal.  Eyes:     Conjunctiva/sclera: Conjunctivae normal.  Cardiovascular:     Rate and Rhythm: Regular rhythm. Tachycardia present.     Heart sounds: No murmur heard.   Pulmonary:     Effort: No respiratory distress.     Breath sounds: No wheezing, rhonchi or rales.  Abdominal:     Palpations: Abdomen is soft.     Tenderness: There is no abdominal tenderness. There is no guarding or rebound.  Musculoskeletal:     Cervical back: Normal range of motion and neck supple.     Right lower leg: No edema.     Left lower leg: No edema.  Skin:    General: Skin is warm and dry.     Findings: No rash.  Neurological:     General: No focal deficit present.     Mental Status: She is alert. Mental status is at baseline.     Motor: No weakness.  Psychiatric:        Mood and Affect: Mood is anxious.     ED Results / Procedures / Treatments   Labs (all labs ordered are listed, but only abnormal results are displayed) Labs Reviewed  CBC WITH DIFFERENTIAL/PLATELET - Abnormal; Notable for the following components:      Result Value   RBC 5.18 (*)    MCV 78.6 (*)    MCH 25.3 (*)    All other components within normal limits  BASIC METABOLIC PANEL - Abnormal; Notable for the following components:   Sodium 133 (*)    CO2 19 (*)    Glucose, Bld 216 (*)    All other components within normal limits  D-DIMER, QUANTITATIVE (NOT AT [facility]) - Abnormal;  Notable for the following components:   D-Dimer, Quant 0.63 (*)    All other components within normal limits  TROPONIN I (HIGH SENSITIVITY)    EKG EKG Interpretation  Date/Time:  Friday September 21 2020 16:14:53 EST Ventricular Rate:  124 PR Interval:  140 QRS Duration: 74 QT Interval:  332 QTC Calculation: 476 R Axis:   69 Text Interpretation: Sinus tachycardia ST & T wave abnormality, consider inferior ischemia Abnormal ECG no acute STEMI Confirmed by Roslynn Amble,  Richard [phone]) on 09/21/2020 4:27:43 PM   Radiology No results found.  Procedures Procedures (including critical care time)  Medications Ordered in ED Medications  LORazepam (ATIVAN) injection 1 mg (has no administration in time range)  ondansetron (ZOFRAN) injection 4 mg (has no administration in time range)  sodium chloride 0.9 % bolus 1,000 mL (has no administration in time range)    ED Course  I have reviewed the triage vital signs and the nursing notes.  Pertinent labs & imaging results that were available during my care of the patient were reviewed by me and considered in my medical decision making (see chart for details).  Patient seen and examined.  Patient is in no apparent distress at time of exam however is extremely anxious.  EKG reviewed demonstrating sinus tachycardia with ST and T wave abnormalities.  Chest x-ray, labs ordered as well as IV fluids, Zofran, Ativan.  Vital signs reviewed and are as follows: BP (!) 176/98   Pulse (!) 118   Temp 98.1 F (36.7 C) (Oral)   Resp (!) 28   Ht 5\' 9"  (1.753 m)   Wt 97.5 kg   LMP  (LMP Unknown)   SpO2 98%   BMI 31.75 kg/m   8:10 PM patient rechecked.  She continues to be tachycardic into the 110's after Ativan and first liter of fluids.  For this reason, D-dimer was ordered.  D-dimer was slightly elevated necessitating need for CT imaging to rule out PE.  Patient will be given a second liter of IV fluids.  Signed out to Dr. Roslynn Amble at shift  change.      MDM Rules/Calculators/A&P                          Patient with shortness of breath and chest tightness in setting of recent COVID infection.  Pending completion of work-up.   Final Clinical Impression(s) / ED Diagnoses Final diagnoses:  None    Rx / DC Orders ED Discharge Orders    None        Carlisle Cater, Hershal Coria 09/21/20 2012    Lucrezia Starch, MD 09/24/20 1531

## 2020-09-28 ENCOUNTER — Other Ambulatory Visit: Payer: Self-pay | Admitting: Family Medicine

## 2020-10-17 ENCOUNTER — Telehealth: Payer: Self-pay | Admitting: Family Medicine

## 2020-10-17 NOTE — Telephone Encounter (Signed)
Needs diabetic f/u scheduled for early March

## 2020-10-23 NOTE — Telephone Encounter (Signed)
Called and left VM for patient to schedule this appt

## 2020-10-25 ENCOUNTER — Other Ambulatory Visit: Payer: Self-pay

## 2020-10-25 MED ORDER — PAROXETINE HCL 40 MG PO TABS: 40.0000 mg | ORAL_TABLET | Freq: Every day | ORAL | 0 refills | Status: DC

## 2020-11-01 ENCOUNTER — Ambulatory Visit (INDEPENDENT_AMBULATORY_CARE_PROVIDER_SITE_OTHER): Payer: 59 | Admitting: Family Medicine

## 2020-11-01 ENCOUNTER — Encounter: Payer: Self-pay | Admitting: Family Medicine

## 2020-11-01 ENCOUNTER — Other Ambulatory Visit: Payer: Self-pay

## 2020-11-01 VITALS — BP 137/83 | HR 108 | Ht 69.0 in | Wt 213.0 lb

## 2020-11-01 DIAGNOSIS — J019 Acute sinusitis, unspecified: Secondary | ICD-10-CM | POA: Diagnosis not present

## 2020-11-01 DIAGNOSIS — E119 Type 2 diabetes mellitus without complications: Secondary | ICD-10-CM

## 2020-11-01 DIAGNOSIS — F4010 Social phobia, unspecified: Secondary | ICD-10-CM

## 2020-11-01 LAB — POCT GLYCOSYLATED HEMOGLOBIN (HGB A1C): Hemoglobin A1C: 6.8 % — AB (ref 4.0–5.6)

## 2020-11-01 LAB — POCT UA - MICROALBUMIN
Albumin/Creatinine Ratio, Urine, POC: <30
Creatinine, POC: 300 mg/dL
Microalbumin Ur, POC: 30 mg/L

## 2020-11-01 MED ORDER — VICTOZA 18 MG/3ML ~~LOC~~ SOPN: 1.8000 mg | PEN_INJECTOR | Freq: Every day | SUBCUTANEOUS | 2 refills | Status: DC

## 2020-11-01 MED ORDER — SYNJARDY 12.5-1000 MG PO TABS: 1.0000 | ORAL_TABLET | Freq: Two times a day (BID) | ORAL | 2 refills | Status: DC

## 2020-11-01 MED ORDER — AZITHROMYCIN 250 MG PO TABS
ORAL_TABLET | ORAL | 0 refills | Status: AC
Start: 2020-11-01 — End: 2020-11-06

## 2020-11-01 NOTE — Assessment & Plan Note (Signed)
A1C looks great today at 6.8.  Urine microalbumin negative.  Did adjust her Victoza so that she is getting a full 90-day supply.  We can discontinue the Janumet and see if we can get

## 2020-11-01 NOTE — Assessment & Plan Note (Signed)
Stable.  Continue current dose of paroxetine.

## 2020-11-01 NOTE — Progress Notes (Signed)
Established Patient Office Visit  Subjective:  Patient ID: Jennifer Gray, female    DOB: November 10, 1965  Age: 54 y.o. MRN: [id number]  CC: No chief complaint on file.   HPI Jennifer Gray presents for   Diabetes - no hypoglycemic events. No wounds or sores that are not healing well. No increased thirst or urination. Checking glucose at home. Taking medications as prescribed without any side effects.  He says the last time she filled the Victoza they only gave her 1 months worth instead of a 90-day supply.  Her Janumet was going to be about $50 she did go ahead and fill it for 1 month but says she cannot afford to continue to pay that.  Follow-up social anxiety disorder -stable and doing well overall.  Currently on Paxil and feels like her regimen is working well.  She did use a couple of Xanax during her mom's passing.  But rarely uses it.  Also c/o of some right sided flank pain x3 days.-she says it her right side has been bothering her mostly of her rib cage she notices it is a little bit more bothersome if she takes a deep breath or if she rotates she does not member any specific injury or trauma.  Though she was leaning over her mom's hospice bed quite a bit in fact her mother just passed away about a week ago on January 22.  She says she is doing okay.  No chest pain or shortness of breath.  No cough.  She is also had some sinus symptoms noticing some thick scabs and congestion.  She says in the last 3 days she feels a lot of pressure in the right side of her face around her eye when she leans forward.  Past Medical History:  Diagnosis Date  . Anxiety   . Anxiety     Past Surgical History:  Procedure Laterality Date  . BACK SURGERY    . BREAST SURGERY      Family History  Problem Relation Age of Onset  . Hypertension Mother   . Cancer Father   . Diabetes Other   . Heart disease Brother 63    Social History   Socioeconomic History  . Marital status: Married     Spouse name: Not on file  . Number of children: Not on file  . Years of education: Not on file  . Highest education level: Not on file  Occupational History  . Not on file  Tobacco Use  . Smoking status: Former Smoker    Years: 10.00    Quit date: 09/17/2006    Years since quitting: 14.1  . Smokeless tobacco: Never Used  Substance and Sexual Activity  . Alcohol use: No  . Drug use: No  . Sexual activity: Not on file  Other Topics Concern  . Not on file  Social History Narrative  . Not on file   Social Determinants of Health   Financial Resource Strain: Not on file  Food Insecurity: Not on file  Transportation Needs: Not on file  Physical Activity: Not on file  Stress: Not on file  Social Connections: Not on file  Intimate Partner Violence: Not on file    Outpatient Medications Prior to Visit  Medication Sig Dispense Refill  . ACCU-CHEK SOFTCLIX LANCETS lancets For testing once a day. Dx:E11.65 100 each 12  . albuterol (VENTOLIN HFA) 108 (90 Base) MCG/ACT inhaler Inhale 2 puffs into the lungs every 6 (six) hours as needed for wheezing  or shortness of breath. 8 g 0  . ALPRAZolam (XANAX) 0.5 MG tablet Take 1 tablet (0.5 mg total) by mouth daily as needed for anxiety or sleep. 30 tablet 1  . AMBULATORY NON FORMULARY MEDICATION Medication Name: glucometer, lancets and strips to test once a day. Dx. Diabetes type 2. 90 days supply 1 Units PRN  . budesonide-formoterol (SYMBICORT) 160-4.5 MCG/ACT inhaler Inhale 2 puffs into the lungs in the morning and at bedtime. 1 each 0  . lansoprazole (PREVACID) 15 MG capsule Take 1 capsule (15 mg total) by mouth at bedtime. 90 capsule 1  . PARoxetine (PAXIL) 40 MG tablet Take 1 tablet (40 mg total) by mouth daily. 90 tablet 0  . JANUMET XR [phone] MG TB24 Take 1 tablet by mouth once daily 90 tablet 0  . liraglutide (VICTOZA) 18 MG/3ML SOPN Inject 1.8 mg into the skin daily. 18 mL 2   No facility-administered medications prior to visit.     Allergies  Allergen Reactions  . Amoxicillin Shortness Of Breath and Rash  . Codeine Other (See Comments)    Headache  . Vicodin [Hydrocodone-Acetaminophen]     ROS Review of Systems    Objective:    Physical Exam Constitutional:      Appearance: She is well-developed and well-nourished.  HENT:     Head: Normocephalic and atraumatic.  Cardiovascular:     Rate and Rhythm: Normal rate and regular rhythm.     Heart sounds: Normal heart sounds.  Pulmonary:     Effort: Pulmonary effort is normal.     Breath sounds: Normal breath sounds.  Skin:    General: Skin is warm and dry.  Neurological:     Mental Status: She is alert and oriented to person, place, and time.  Psychiatric:        Mood and Affect: Mood and affect normal.        Behavior: Behavior normal.     BP 137/83   Pulse (!) 108   Ht 5\' 9"  (1.753 m)   Wt 213 lb (96.6 kg)   LMP  (LMP Unknown)   SpO2 98%   BMI 31.45 kg/m  Wt Readings from Last 3 Encounters:  11/01/20 213 lb (96.6 kg)  09/21/20 215 lb (97.5 kg)  04/17/20 210 lb (95.3 kg)     Health Maintenance Due  Topic Date Due  . Hepatitis C Screening  Never done  . PNEUMOCOCCAL POLYSACCHARIDE VACCINE AGE 20-64 HIGH RISK  Never done    There are no preventive care reminders to display for this patient.  Lab Results  Component Value Date   TSH 3.80 03/08/2018   Lab Results  Component Value Date   WBC 8.7 09/21/2020   HGB 13.1 09/21/2020   HCT 40.7 09/21/2020   MCV 78.6 (L) 09/21/2020   PLT 373 09/21/2020   Lab Results  Component Value Date   NA 133 (L) 09/21/2020   K 4.3 09/21/2020   CO2 19 (L) 09/21/2020   GLUCOSE 216 (H) 09/21/2020   BUN 14 09/21/2020   CREATININE 0.93 09/21/2020   BILITOT 0.4 10/25/2019   ALKPHOS 70 10/22/2016   AST 15 10/25/2019   ALT 22 10/25/2019   PROT 6.8 10/25/2019   ALBUMIN 4.4 10/22/2016   CALCIUM 8.9 09/21/2020   ANIONGAP 13 09/21/2020   Lab Results  Component Value Date   CHOL 230 (H)  03/08/2018   Lab Results  Component Value Date   HDL 43 (L) 03/08/2018   Lab Results  Component Value Date   LDLCALC 155 (H) 03/08/2018   Lab Results  Component Value Date   TRIG 188 (H) 03/08/2018   Lab Results  Component Value Date   CHOLHDL 5.3 (H) 03/08/2018   Lab Results  Component Value Date   HGBA1C 6.8 (A) 11/01/2020      Assessment & Plan:   Problem List Items Addressed This Visit      Endocrine   Controlled type 2 diabetes mellitus without complication, without long-term current use of insulin ([place]) - Primary    A1C looks great today at 6.8.  Urine microalbumin negative.  Did adjust her Victoza so that she is getting a full 90-day supply.  We can discontinue the Janumet and see if we can get      Relevant Medications   liraglutide (VICTOZA) 18 MG/3ML SOPN   Empagliflozin-metFORMIN HCl (SYNJARDY) 12.12-998 MG TABS   Other Relevant Orders   POCT glycosylated hemoglobin (Hb A1C) (Completed)   POCT UA - Microalbumin (Completed)   CBC   COMPLETE METABOLIC PANEL WITH GFR   Lipid panel     Other   Social anxiety disorder    Stable.  Continue current dose of paroxetine.       Other Visit Diagnoses    Acute non-recurrent sinusitis, unspecified location       Relevant Medications   azithromycin (ZITHROMAX) 250 MG tablet     Acute sinusitis-we will go ahead and treat with azithromycin if not better over the weekend then please let us know.  Meds ordered this encounter  Medications  . azithromycin (ZITHROMAX) 250 MG tablet    Sig: 2 Ttabs PO on Day 1, then one a day x 4 days.    Dispense:  6 tablet    Refill:  0  . liraglutide (VICTOZA) 18 MG/3ML SOPN    Sig: Inject 1.8 mg into the skin daily.    Dispense:  27 mL    Refill:  2  . Empagliflozin-metFORMIN HCl (SYNJARDY) 12.12-998 MG TABS    Sig: Take 1 tablet by mouth 2 (two) times daily.    Dispense:  60 tablet    Refill:  2    In place of Janumet    Follow-up: No follow-ups on file.     Beatrice Lecher, MD

## 2020-11-02 LAB — COMPLETE METABOLIC PANEL WITH GFR
AG Ratio: 1.8 (calc) (ref 1.0–2.5)
ALT: 25 U/L (ref 6–29)
AST: 18 U/L (ref 10–35)
Albumin: 4.8 g/dL (ref 3.6–5.1)
Alkaline phosphatase (APISO): 67 U/L (ref 37–153)
BUN: 16 mg/dL (ref 7–25)
CO2: 24 mmol/L (ref 20–32)
Calcium: 9.8 mg/dL (ref 8.6–10.4)
Chloride: 104 mmol/L (ref 98–110)
Creat: 0.9 mg/dL (ref 0.50–1.05)
GFR, Est African American: 83 mL/min/{1.73_m2} (ref >=60)
GFR, Est Non African American: 72 mL/min/{1.73_m2} (ref >=60)
Globulin: 2.7 g/dL (calc) (ref 1.9–3.7)
Glucose, Bld: 128 mg/dL (ref 65–139)
Potassium: 4.4 mmol/L (ref 3.5–5.3)
Sodium: 139 mmol/L (ref 135–146)
Total Bilirubin: 0.4 mg/dL (ref 0.2–1.2)
Total Protein: 7.5 g/dL (ref 6.1–8.1)

## 2020-11-02 LAB — CBC
HCT: 42.2 % (ref 35.0–45.0)
Hemoglobin: 14.3 g/dL (ref 11.7–15.5)
MCH: 25.7 pg — ABNORMAL LOW (ref 27.0–33.0)
MCHC: 33.9 g/dL (ref 32.0–36.0)
MCV: 75.8 fL — ABNORMAL LOW (ref 80.0–100.0)
MPV: 9.4 fL (ref 7.5–12.5)
Platelets: 538 10*3/uL — ABNORMAL HIGH (ref 140–400)
RBC: 5.57 10*6/uL — ABNORMAL HIGH (ref 3.80–5.10)
RDW: 13.6 % (ref 11.0–15.0)
WBC: 9.7 10*3/uL (ref 3.8–10.8)

## 2020-11-02 LAB — LIPID PANEL
Cholesterol: 281 mg/dL — ABNORMAL HIGH (ref <200)
HDL: 55 mg/dL (ref >=50)
LDL Cholesterol (Calc): 184 mg/dL (calc) — ABNORMAL HIGH
Non-HDL Cholesterol (Calc): 226 mg/dL (calc) — ABNORMAL HIGH (ref <130)
Total CHOL/HDL Ratio: 5.1 (calc) — ABNORMAL HIGH (ref <5.0)
Triglycerides: 226 mg/dL — ABNORMAL HIGH (ref <150)

## 2020-11-10 ENCOUNTER — Encounter: Payer: Self-pay | Admitting: Family Medicine

## 2020-11-12 MED ORDER — ATORVASTATIN CALCIUM 20 MG PO TABS: 20.0000 mg | ORAL_TABLET | Freq: Every day | ORAL | 3 refills | Status: DC

## 2020-11-12 NOTE — Telephone Encounter (Signed)
Med sent to [facility]

## 2020-12-04 ENCOUNTER — Encounter: Payer: Self-pay | Admitting: Family Medicine

## 2020-12-06 NOTE — Telephone Encounter (Signed)
Ok to RF? 

## 2020-12-07 NOTE — Telephone Encounter (Signed)
Okay, I apologize for the confusion.  In looking back she is not supposed to be on the Janumet anymore.  The metformin is in the [place] that she takes and the Victoza shot takes place of the [place].  So she is just supposed to be doing the Victoza and the [place].  So again I apologize for the confusion.

## 2021-01-08 ENCOUNTER — Other Ambulatory Visit: Payer: Self-pay | Admitting: Family Medicine

## 2021-01-11 ENCOUNTER — Telehealth: Payer: Self-pay

## 2021-01-11 NOTE — Telephone Encounter (Signed)
Jennifer Gray called for a refill on Janumet. I advised her Janumet was not on her medication list. I advised her that Iva Boop is on her medication list. Also that it was sent to the pharmacy in March. She states she would call the pharmacy to see if she could pick up the prescription.    I called her back later to make sure she was able to pick up the [facility]. Left a message for a return call if she was unable to pick up the prescription.   I called [facility] and they do have the prescription and will get it ready for pick up. The cost is only $10.

## 2021-01-17 ENCOUNTER — Other Ambulatory Visit: Payer: Self-pay

## 2021-01-17 ENCOUNTER — Telehealth (INDEPENDENT_AMBULATORY_CARE_PROVIDER_SITE_OTHER): Payer: 59 | Admitting: Family Medicine

## 2021-01-17 ENCOUNTER — Emergency Department (HOSPITAL_BASED_OUTPATIENT_CLINIC_OR_DEPARTMENT_OTHER)
Admission: EM | Admit: 2021-01-17 | Discharge: 2021-01-17 | Disposition: A | Discharge status: Home / Self Care | Payer: 59 | Attending: Emergency Medicine | Admitting: Emergency Medicine

## 2021-01-17 ENCOUNTER — Emergency Department (HOSPITAL_BASED_OUTPATIENT_CLINIC_OR_DEPARTMENT_OTHER): Payer: 59

## 2021-01-17 ENCOUNTER — Encounter (HOSPITAL_BASED_OUTPATIENT_CLINIC_OR_DEPARTMENT_OTHER): Payer: Self-pay | Admitting: *Deleted

## 2021-01-17 ENCOUNTER — Encounter: Payer: Self-pay | Admitting: Family Medicine

## 2021-01-17 DIAGNOSIS — Z7984 Long term (current) use of oral hypoglycemic drugs: Secondary | ICD-10-CM | POA: Diagnosis not present

## 2021-01-17 DIAGNOSIS — R739 Hyperglycemia, unspecified: Secondary | ICD-10-CM

## 2021-01-17 DIAGNOSIS — R0789 Other chest pain: Secondary | ICD-10-CM | POA: Diagnosis not present

## 2021-01-17 DIAGNOSIS — E1165 Type 2 diabetes mellitus with hyperglycemia: Secondary | ICD-10-CM | POA: Diagnosis not present

## 2021-01-17 DIAGNOSIS — R5383 Other fatigue: Secondary | ICD-10-CM | POA: Insufficient documentation

## 2021-01-17 DIAGNOSIS — I10 Essential (primary) hypertension: Secondary | ICD-10-CM | POA: Insufficient documentation

## 2021-01-17 DIAGNOSIS — Z794 Long term (current) use of insulin: Secondary | ICD-10-CM | POA: Diagnosis not present

## 2021-01-17 DIAGNOSIS — Z20822 Contact with and (suspected) exposure to covid-19: Secondary | ICD-10-CM | POA: Insufficient documentation

## 2021-01-17 DIAGNOSIS — B349 Viral infection, unspecified: Secondary | ICD-10-CM | POA: Diagnosis not present

## 2021-01-17 DIAGNOSIS — R000 Tachycardia, unspecified: Secondary | ICD-10-CM | POA: Insufficient documentation

## 2021-01-17 DIAGNOSIS — Z87891 Personal history of nicotine dependence: Secondary | ICD-10-CM | POA: Diagnosis not present

## 2021-01-17 DIAGNOSIS — R5381 Other malaise: Secondary | ICD-10-CM

## 2021-01-17 HISTORY — DX: Type 2 diabetes mellitus without complications: E11.9

## 2021-01-17 HISTORY — DX: Essential (primary) hypertension: I10

## 2021-01-17 LAB — CBC WITH DIFFERENTIAL/PLATELET
Abs Immature Granulocytes: 0.03 10*3/uL (ref 0.00–0.07)
Basophils Absolute: 0.1 10*3/uL (ref 0.0–0.1)
Basophils Relative: 1 %
Eosinophils Absolute: 0.1 10*3/uL (ref 0.0–0.5)
Eosinophils Relative: 1 %
HCT: 44.4 % (ref 36.0–46.0)
Hemoglobin: 14.4 g/dL (ref 12.0–15.0)
Immature Granulocytes: 0 %
Lymphocytes Relative: 26 %
Lymphs Abs: 2.6 10*3/uL (ref 0.7–4.0)
MCH: 25.5 pg — ABNORMAL LOW (ref 26.0–34.0)
MCHC: 32.4 g/dL (ref 30.0–36.0)
MCV: 78.7 fL — ABNORMAL LOW (ref 80.0–100.0)
Monocytes Absolute: 0.8 10*3/uL (ref 0.1–1.0)
Monocytes Relative: 8 %
Neutro Abs: 6.4 10*3/uL (ref 1.7–7.7)
Neutrophils Relative %: 64 %
Platelets: 439 10*3/uL — ABNORMAL HIGH (ref 150–400)
RBC: 5.64 MIL/uL — ABNORMAL HIGH (ref 3.87–5.11)
RDW: 14.1 % (ref 11.5–15.5)
WBC: 10 10*3/uL (ref 4.0–10.5)
nRBC: 0 % (ref 0.0–0.2)

## 2021-01-17 LAB — CBG MONITORING, ED: Glucose-Capillary: 190 mg/dL — ABNORMAL HIGH (ref 70–99)

## 2021-01-17 LAB — COMPREHENSIVE METABOLIC PANEL
ALT: 31 U/L (ref 0–44)
AST: 22 U/L (ref 15–41)
Albumin: 4.6 g/dL (ref 3.5–5.0)
Alkaline Phosphatase: 84 U/L (ref 38–126)
Anion gap: 13 (ref 5–15)
BUN: 19 mg/dL (ref 6–20)
CO2: 23 mmol/L (ref 22–32)
Calcium: 9.4 mg/dL (ref 8.9–10.3)
Chloride: 98 mmol/L (ref 98–111)
Creatinine, Ser: 1.15 mg/dL — ABNORMAL HIGH (ref 0.44–1.00)
GFR, Estimated: 56 mL/min — ABNORMAL LOW (ref >60)
Glucose, Bld: 188 mg/dL — ABNORMAL HIGH (ref 70–99)
Potassium: 4.3 mmol/L (ref 3.5–5.1)
Sodium: 134 mmol/L — ABNORMAL LOW (ref 135–145)
Total Bilirubin: 0.5 mg/dL (ref 0.3–1.2)
Total Protein: 7.9 g/dL (ref 6.5–8.1)

## 2021-01-17 LAB — TROPONIN I (HIGH SENSITIVITY): Troponin I (High Sensitivity): 4 ng/L (ref <18)

## 2021-01-17 MED ORDER — SODIUM CHLORIDE 0.9 % IV BOLUS
1000.0000 mL | Freq: Once | INTRAVENOUS | Status: AC
  Administered 2021-01-17: 1000 mL via INTRAVENOUS

## 2021-01-17 NOTE — Assessment & Plan Note (Signed)
She does not feel well at all with multiple symptoms suggestive of possible COVID infection.  She seems to be dehydrated and I recommended that she be seen at her closest ED for evaluation.  Alternatively, but I think less likely, is that she may have DKA as her blood sugars have been quite elevated recently as well.  She expresses understanding and will proceed to the ED.

## 2021-01-17 NOTE — ED Triage Notes (Signed)
C/o increased BS cough , thirst and urination .

## 2021-01-17 NOTE — Progress Notes (Signed)
Blood Sugar:  232 @ 315pm   270 @ 700am   320 @ 9pm 01/16/21  No Victoza x 1 month  Current symptoms: Thirsty, frequent urination, severe fatigue, headache, SOB, chest tightness

## 2021-01-17 NOTE — ED Provider Notes (Signed)
[facility]ER[facility] DEPARTMENT Provider Note   CSN: [id number] Arrival date & time: 01/17/21  1622     History Chief Complaint  Patient presents with  . Hyperglycemia    Jennifer Gray is a 55 y.o. female w PMHx HTN, T2DM, presenting for evaluation of hyperglycemia and URI illness.  Reports high BG at home x2 days.  Associated increased thirst and polyuria for last 4 days.  PCP sent to rule out DKA.  Yolanda Bonine has covid. Coughing started 1 week ago, HA, shortness breath, some sore throat and runny nose, body aches. No covid shots. Intermittent chest discomfort with movement.   The history is provided by the patient.       Past Medical History:  Diagnosis Date  . Anxiety   . Anxiety   . Diabetes mellitus without complication ([place])   . Hypertension     Patient Active Problem List   Diagnosis Date Noted  . Malaise and fatigue 01/17/2021  . GERD (gastroesophageal reflux disease) 09/13/2020  . OSA (obstructive sleep apnea) 06/10/2019  . Fatty liver 05/04/2019  . Acne 09/17/2018  . Controlled type 2 diabetes mellitus without complication, without long-term current use of insulin ([place]) 05/26/2016  . Chronic idiopathic constipation 02/13/2015  . Social anxiety disorder 08/01/2013  . Radiculitis of left cervical region 09/30/2012    Past Surgical History:  Procedure Laterality Date  . BACK SURGERY    . BREAST SURGERY       OB History   No obstetric history on file.     Family History  Problem Relation Age of Onset  . Hypertension Mother   . Cancer Father   . Diabetes Other   . Heart disease Brother 17    Social History   Tobacco Use  . Smoking status: Former Smoker    Years: 10.00    Quit date: 09/17/2006    Years since quitting: 14.3  . Smokeless tobacco: Never Used  Substance Use Topics  . Alcohol use: No  . Drug use: No    Home Medications Prior to Admission medications   Medication Sig Start Date End Date Taking? Authorizing  Provider  ACCU-CHEK SOFTCLIX LANCETS lancets For testing once a day. Dx:E11.65 04/08/18   Hali Marry, MD  albuterol (VENTOLIN HFA) 108 (90 Base) MCG/ACT inhaler Inhale 2 puffs into the lungs every 6 (six) hours as needed for wheezing or shortness of breath. 09/13/20   Hali Marry, MD  ALPRAZolam Duanne Moron) 0.5 MG tablet Take 1 tablet (0.5 mg total) by mouth daily as needed for anxiety or sleep. 10/26/19   Early, Coralee Pesa, NP  AMBULATORY NON FORMULARY MEDICATION Medication Name: glucometer, lancets and strips to test once a day. Dx. Diabetes type 2. 90 days supply 05/26/16   Hali Marry, MD  atorvastatin (LIPITOR) 20 MG tablet Take 1 tablet (20 mg total) by mouth at bedtime. 11/12/20   Hali Marry, MD  budesonide-formoterol (SYMBICORT) 160-4.5 MCG/ACT inhaler Inhale 2 puffs into the lungs in the morning and at bedtime. 09/13/20   Hali Marry, MD  Empagliflozin-metFORMIN HCl (SYNJARDY) 12.12-998 MG TABS Take 1 tablet by mouth 2 (two) times daily. 11/01/20   Hali Marry, MD  lansoprazole (PREVACID) 15 MG capsule Take 1 capsule (15 mg total) by mouth at bedtime. 09/13/20   Hali Marry, MD  liraglutide (VICTOZA) 18 MG/3ML SOPN Inject 1.8 mg into the skin daily. 11/01/20   Hali Marry, MD  PARoxetine (PAXIL) 40 MG tablet Take 1  tablet (40 mg total) by mouth daily. 10/25/20   Emeterio Reeve, DO    Allergies    Amoxicillin, Codeine, and Vicodin [hydrocodone-acetaminophen]  Review of Systems   Review of Systems  Constitutional: Positive for fatigue. Negative for fever.  HENT: Positive for rhinorrhea and sore throat.   Respiratory: Positive for cough and shortness of breath.   Cardiovascular: Positive for chest pain.  Endocrine: Positive for polydipsia and polyuria.  Musculoskeletal: Positive for myalgias.  Neurological: Positive for headaches.  All other systems reviewed and are negative.   Physical Exam Updated Vital Signs BP  128/82   Pulse 94   Temp 98.8 F (37.1 C) (Oral)   Resp 11   Ht 5\' 9"  (1.753 m)   Wt 97.5 kg   LMP  (LMP Unknown)   SpO2 99%   BMI 31.75 kg/m   Physical Exam Vitals and nursing note reviewed.  Constitutional:      General: She is not in acute distress.    Appearance: She is well-developed.  HENT:     Head: Normocephalic and atraumatic.     Mouth/Throat:     Mouth: Mucous membranes are moist.     Pharynx: Oropharynx is clear.  Eyes:     Conjunctiva/sclera: Conjunctivae normal.  Cardiovascular:     Rate and Rhythm: Regular rhythm. Tachycardia present.  Pulmonary:     Effort: Pulmonary effort is normal. No respiratory distress.     Breath sounds: Normal breath sounds.  Chest:     Chest wall: Tenderness present.  Abdominal:     General: Bowel sounds are normal.     Palpations: Abdomen is soft.     Tenderness: There is no abdominal tenderness. There is no guarding or rebound.  Musculoskeletal:     Cervical back: Normal range of motion.  Skin:    General: Skin is warm.  Neurological:     Mental Status: She is alert.  Psychiatric:        Behavior: Behavior normal.     ED Results / Procedures / Treatments   Labs (all labs ordered are listed, but only abnormal results are displayed) Labs Reviewed  CBC WITH DIFFERENTIAL/PLATELET - Abnormal; Notable for the following components:      Result Value   RBC 5.64 (*)    MCV 78.7 (*)    MCH 25.5 (*)    Platelets 439 (*)    All other components within normal limits  COMPREHENSIVE METABOLIC PANEL - Abnormal; Notable for the following components:   Sodium 134 (*)    Glucose, Bld 188 (*)    Creatinine, Ser 1.15 (*)    GFR, Estimated 56 (*)    All other components within normal limits  CBG MONITORING, ED - Abnormal; Notable for the following components:   Glucose-Capillary 190 (*)    All other components within normal limits  SARS CORONAVIRUS 2 (TAT 6-24 HRS)  TROPONIN I (HIGH SENSITIVITY)    EKG EKG  Interpretation  Date/Time:  Thursday Jan 17 2021 16:32:03 EDT Ventricular Rate:  117 PR Interval:  126 QRS Duration: 76 QT Interval:  330 QTC Calculation: 460 R Axis:   64 Text Interpretation: Sinus tachycardia Otherwise normal ECG No significant change since prior 1/22 Confirmed by Aletta Edouard [phone]) on 01/17/2021 4:37:03 PM   Radiology DG Chest Port 1 View  Result Date: 01/17/2021 CLINICAL DATA:  Hyperglycemia and cough, initial encounter EXAM: PORTABLE CHEST 1 VIEW COMPARISON:  09/21/2020 FINDINGS: The heart size and mediastinal contours are within normal limits.  Both lungs are clear. The visualized skeletal structures are unremarkable. IMPRESSION: No active disease. Electronically Signed   By: Inez Catalina M.D.   On: 01/17/2021 19:40    Procedures Procedures   Medications Ordered in ED Medications  sodium chloride 0.9 % bolus 1,000 mL (0 mLs Intravenous Stopped 01/17/21 2026)    ED Course  I have reviewed the triage vital signs and the nursing notes.  Pertinent labs & imaging results that were available during my care of the patient were reviewed by me and considered in my medical decision making (see chart for details).    MDM Rules/Calculators/A&P                          Patient is a 55 year old female sent in by PCP to rule out DKA in the setting of hyperglycemia.  She is hyperglycemic here, however with normal gap and bicarb.  Show signs of mild dehydration with tachycardia about 115.  She also has COVID-like symptoms with positive exposure.  She is afebrile here, lungs are clear, abdomen is benign.  There is some chest tenderness on exam, patient reports some pain with movement.  Troponin is negative, presentation seems less likely ACS.  Chest x-ray is clear.  EKG is nonischemic. Most likely related to viral illness.  Tachycardia tachycardia resolves with IV fluids.  Reports some improvement in symptoms.  COVID swab is pending.  At this time, patient is appropriate  for discharge with close outpatient follow-up.  Encouraged good oral hydration, symptomatic management for viral illness.  Strict return precautions  Discussed results, findings, treatment and follow up. Patient advised of return precautions. Patient verbalized understanding and agreed with plan.  Kindal Ponti was evaluated in Emergency Department on 01/17/2021 for the symptoms described in the history of present illness. She was evaluated in the context of the global COVID-19 pandemic, which necessitated consideration that the patient might be at risk for infection with the SARS-CoV-2 virus that causes COVID-19. Institutional protocols and algorithms that pertain to the evaluation of patients at risk for COVID-19 are in a state of rapid change based on information released by regulatory bodies including the CDC and federal and state organizations. These policies and algorithms were followed during the patient's care in the ED.  Final Clinical Impression(s) / ED Diagnoses Final diagnoses:  Hyperglycemia  Viral illness    Rx / DC Orders ED Discharge Orders    None       Lillis Nuttle, [place] N, PA-C 01/17/21 2116    Hayden Rasmussen, MD 01/18/21 [phone]

## 2021-01-17 NOTE — Discharge Instructions (Addendum)
  Treat your viral symptoms with over-the-counter medications as needed. Follow close with your primary care provider.  Monitor your blood sugars.  You can alternate Tylenol/acetaminophen and Advil/ibuprofen/Motrin every 4 hours for sore throat, body aches, headache or fever.  Drink plenty of water.  Use saline nasal spray for congestion. Wash your hands frequently. You have a COVID test pending. Please isolate at home while awaiting your results.  You can follow your results on MyChart. > If your test is negative, stay home until your fever has resolved/your symptoms are improving. > If your test is positive, isolate at home for at least 7 days after the day your symptoms initially began, and THEN at least 24 hours after you are fever-free without the help of medications AND your symptoms are improving. Only once your symptoms are improving and you are fever-free can you come out out of quarantine. Once, then you should wear a mask in public for another 5 days. Follow up with your primary care provider. Return to the ER for significant shortness of breath, uncontrollable vomiting, severe chest pain, or other concerning symptoms.

## 2021-01-17 NOTE — Progress Notes (Signed)
Jennifer Gray - 55 y.o. female MRN [id number]  Date of birth: [birth year]   This visit type was conducted due to national recommendations for restrictions regarding the COVID-19 Pandemic (e.g. social distancing).  This format is felt to be most appropriate for this patient at this time.  All issues noted in this document were discussed and addressed.  No physical exam was performed (except for noted visual exam findings with Video Visits).  I discussed the limitations of evaluation and management by telemedicine and the availability of in person appointments. The patient expressed understanding and agreed to proceed.  I connected with@ on 01/17/21 at  3:10 PM EDT by a video enabled telemedicine application and verified that I am speaking with the correct person using two identifiers.  Interactive audio and video telecommunications were attempted between this provider and patient, however failed, due to patient having technical difficulties OR patient did not have access to video capability.  We continued and completed visit with audio only.    Present at visit: Jennifer Nutting, DO [employer]   Patient Location: [place] [address]   Provider location:   [place]  No chief complaint on file.   HPI  Jennifer Gray is a 55 y.o. female who presents via audio/video conferencing for a telehealth visit today.  She has complaint of increased thirst, urination, elevated blood sugars, severe fatigue, abdominal pain, nausea, headache, and shortness of breath.  States she just feels awful.  These symptoms started a few days ago.  She has been without victoza for about 1 month.  Blood sugars over the past few days have been 250-320.  She feels like she just can't get enough water in.  She has not taken a COVID test but reports that her grandchild that she is around frequently is currently COVID positive.     ROS:  A comprehensive ROS was completed and negative  except as noted per HPI  Past Medical History:  Diagnosis Date  . Anxiety   . Anxiety     Past Surgical History:  Procedure Laterality Date  . BACK SURGERY    . BREAST SURGERY      Family History  Problem Relation Age of Onset  . Hypertension Mother   . Cancer Father   . Diabetes Other   . Heart disease Brother 82    Social History   Socioeconomic History  . Marital status: Married    Spouse name: Not on file  . Number of children: Not on file  . Years of education: Not on file  . Highest education level: Not on file  Occupational History  . Not on file  Tobacco Use  . Smoking status: Former Smoker    Years: 10.00    Quit date: 09/17/2006    Years since quitting: 14.3  . Smokeless tobacco: Never Used  Substance and Sexual Activity  . Alcohol use: No  . Drug use: No  . Sexual activity: Not on file  Other Topics Concern  . Not on file  Social History Narrative  . Not on file   Social Determinants of Health   Financial Resource Strain: Not on file  Food Insecurity: Not on file  Transportation Needs: Not on file  Physical Activity: Not on file  Stress: Not on file  Social Connections: Not on file  Intimate Partner Violence: Not on file     Current Outpatient Medications:  .  ACCU-CHEK SOFTCLIX LANCETS lancets, For testing once a day. Dx:E11.65,  Disp: 100 each, Rfl: 12 .  albuterol (VENTOLIN HFA) 108 (90 Base) MCG/ACT inhaler, Inhale 2 puffs into the lungs every 6 (six) hours as needed for wheezing or shortness of breath., Disp: 8 g, Rfl: 0 .  ALPRAZolam (XANAX) 0.5 MG tablet, Take 1 tablet (0.5 mg total) by mouth daily as needed for anxiety or sleep., Disp: 30 tablet, Rfl: 1 .  AMBULATORY NON FORMULARY MEDICATION, Medication Name: glucometer, lancets and strips to test once a day. Dx. Diabetes type 2. 90 days supply, Disp: 1 Units, Rfl: PRN .  atorvastatin (LIPITOR) 20 MG tablet, Take 1 tablet (20 mg total) by mouth at bedtime., Disp: 90 tablet, Rfl: 3 .   budesonide-formoterol (SYMBICORT) 160-4.5 MCG/ACT inhaler, Inhale 2 puffs into the lungs in the morning and at bedtime., Disp: 1 each, Rfl: 0 .  Empagliflozin-metFORMIN HCl (SYNJARDY) 12.12-998 MG TABS, Take 1 tablet by mouth 2 (two) times daily., Disp: 60 tablet, Rfl: 2 .  lansoprazole (PREVACID) 15 MG capsule, Take 1 capsule (15 mg total) by mouth at bedtime., Disp: 90 capsule, Rfl: 1 .  liraglutide (VICTOZA) 18 MG/3ML SOPN, Inject 1.8 mg into the skin daily., Disp: 27 mL, Rfl: 2 .  PARoxetine (PAXIL) 40 MG tablet, Take 1 tablet (40 mg total) by mouth daily., Disp: 90 tablet, Rfl: 0  EXAM:  VITALS per patient if applicable: Ht 5\' 9"  (1.753 m)   Wt 215 lb (97.5 kg)   LMP  (LMP Unknown)   BMI 31.75 kg/m   GENERAL: alert, oriented, in no acute distress  PSYCH/NEURO: pleasant and cooperative, no obvious depression or anxiety, speech and thought processing grossly intact  ASSESSMENT AND PLAN:  Discussed the following assessment and plan:  Malaise and fatigue She does not feel well at all with multiple symptoms suggestive of possible COVID infection.  She seems to be dehydrated and I recommended that she be seen at her closest ED for evaluation.  Alternatively, but I think less likely, is that she may have DKA as her blood sugars have been quite elevated recently as well.  She expresses understanding and will proceed to the ED.    25 minutes spent including pre visit preparation, review of prior notes and labs, encounter with patient via video visit and same day documentation.    I discussed the assessment and treatment plan with the patient. The patient was provided an opportunity to ask questions and all were answered. The patient agreed with the plan and demonstrated an understanding of the instructions.   The patient was advised to call back or seek an in-person evaluation if the symptoms worsen or if the condition fails to improve as anticipated.    Jennifer Nutting, DO

## 2021-01-18 LAB — SARS CORONAVIRUS 2 (TAT 6-24 HRS): SARS Coronavirus 2: NEGATIVE

## 2021-01-31 ENCOUNTER — Ambulatory Visit (INDEPENDENT_AMBULATORY_CARE_PROVIDER_SITE_OTHER): Payer: 59 | Admitting: Family Medicine

## 2021-01-31 ENCOUNTER — Other Ambulatory Visit: Payer: Self-pay

## 2021-01-31 ENCOUNTER — Encounter: Payer: Self-pay | Admitting: Family Medicine

## 2021-01-31 VITALS — BP 156/71 | HR 99 | Temp 99.3°F | Ht 69.0 in | Wt 215.0 lb

## 2021-01-31 DIAGNOSIS — R059 Cough, unspecified: Secondary | ICD-10-CM

## 2021-01-31 DIAGNOSIS — R5381 Other malaise: Secondary | ICD-10-CM | POA: Diagnosis not present

## 2021-01-31 DIAGNOSIS — R5383 Other fatigue: Secondary | ICD-10-CM

## 2021-01-31 DIAGNOSIS — E119 Type 2 diabetes mellitus without complications: Secondary | ICD-10-CM | POA: Diagnosis not present

## 2021-01-31 DIAGNOSIS — F4010 Social phobia, unspecified: Secondary | ICD-10-CM

## 2021-01-31 DIAGNOSIS — R350 Frequency of micturition: Secondary | ICD-10-CM | POA: Diagnosis not present

## 2021-01-31 LAB — POCT URINALYSIS DIP (CLINITEK)
Bilirubin, UA: NEGATIVE
Blood, UA: NEGATIVE
Glucose, UA: >=1000 mg/dL — AB
Leukocytes, UA: NEGATIVE
Nitrite, UA: NEGATIVE
POC PROTEIN,UA: NEGATIVE
Spec Grav, UA: 1.015 (ref 1.010–1.025)
Urobilinogen, UA: 0.2 E.U./dL
pH, UA: 5 (ref 5.0–8.0)

## 2021-01-31 LAB — POCT GLYCOSYLATED HEMOGLOBIN (HGB A1C): Hemoglobin A1C: 8.6 % — AB (ref 4.0–5.6)

## 2021-01-31 MED ORDER — CEFDINIR 300 MG PO CAPS: 300.0000 mg | ORAL_CAPSULE | Freq: Two times a day (BID) | ORAL | 0 refills | Status: DC

## 2021-01-31 MED ORDER — ALPRAZOLAM 0.5 MG PO TABS: 0.5000 mg | ORAL_TABLET | Freq: Every day | ORAL | 1 refills | Status: DC | PRN

## 2021-01-31 MED ORDER — PAROXETINE HCL 40 MG PO TABS: 40.0000 mg | ORAL_TABLET | Freq: Every day | ORAL | 1 refills | Status: DC

## 2021-01-31 MED ORDER — GLUCOSE BLOOD VI STRP: ORAL_STRIP | 12 refills | Status: AC

## 2021-01-31 MED ORDER — INSULIN PEN NEEDLE 33G X 6 MM MISC: 0 refills | Status: DC

## 2021-01-31 MED ORDER — GLIPIZIDE 5 MG PO TABS: 5.0000 mg | ORAL_TABLET | Freq: Two times a day (BID) | ORAL | 3 refills | Status: DC

## 2021-01-31 NOTE — Patient Instructions (Addendum)
Start Toujeo 10 units at night.  Go up by 2 units every 2 days until sugar in the morning is less than 130.   Please send me your blood sugar numbers in about 2 weeks so that we can make some adjustments from there.

## 2021-01-31 NOTE — Addendum Note (Signed)
Addended by: Teddy Spike on: 01/31/2021 04:53 PM   Modules accepted: Orders

## 2021-01-31 NOTE — Progress Notes (Signed)
Established Patient Office Visit  Subjective:  Patient ID: Jennifer Gray, female    DOB: 08-04-1966  Age: 55 y.o. MRN: [id number]  CC:  Chief Complaint  Patient presents with  . Diabetes  . Sore Throat  . Sinusitis    HPI Jennifer Gray presents for   Diabetes - no hypoglycemic events. No wounds or sores that are not healing well. No increased thirst or urination. Checking glucose at home.  Not been able to afford the Victoza for about a month.  She says it is quite costly with her current insurance program.  She is still on [place].  Is noticed just a huge difference in how she physically feels since coming off the Victoza and her blood sugars have been running in the 200s to 300s off of the medication.  She has really tried to make some great dietary changes.  She is really cut back on carbs and sweet drinks she drinks plenty of water daily.  He says really ever since the 19th she has been working hard to control her glucose with her diet but is still running well over 200 on a daily basis.  She just feels like something is off.  Her weight is actually is stable.     C/o Urinary Frequency  -she is also noticed some frequency no burning or blood in the urine.  He also complains of sinus symptoms and sore throat that is been going on for probably a month.  She actually went to the emergency room in May about 2 weeks ago and tested negative for COVID.  But she is also been around her grandson who also has had COVID she herself had COVID back in January.  Past Medical History:  Diagnosis Date  . Anxiety   . Anxiety   . Diabetes mellitus without complication ([place])   . Hypertension     Past Surgical History:  Procedure Laterality Date  . BACK SURGERY    . BREAST SURGERY      Family History  Problem Relation Age of Onset  . Hypertension Mother   . Cancer Father   . Diabetes Other   . Heart disease Brother 73    Social History   Socioeconomic History  . Marital  status: Married    Spouse name: Not on file  . Number of children: Not on file  . Years of education: Not on file  . Highest education level: Not on file  Occupational History  . Not on file  Tobacco Use  . Smoking status: Former Smoker    Years: 10.00    Quit date: 09/17/2006    Years since quitting: 14.3  . Smokeless tobacco: Never Used  Substance and Sexual Activity  . Alcohol use: No  . Drug use: No  . Sexual activity: Not on file  Other Topics Concern  . Not on file  Social History Narrative  . Not on file   Social Determinants of Health   Financial Resource Strain: Not on file  Food Insecurity: Not on file  Transportation Needs: Not on file  Physical Activity: Not on file  Stress: Not on file  Social Connections: Not on file  Intimate Partner Violence: Not on file    Outpatient Medications Prior to Visit  Medication Sig Dispense Refill  . ACCU-CHEK SOFTCLIX LANCETS lancets For testing once a day. Dx:E11.65 100 each 12  . albuterol (VENTOLIN HFA) 108 (90 Base) MCG/ACT inhaler Inhale 2 puffs into the lungs every 6 (six) hours  as needed for wheezing or shortness of breath. 8 g 0  . AMBULATORY NON FORMULARY MEDICATION Medication Name: glucometer, lancets and strips to test once a day. Dx. Diabetes type 2. 90 days supply 1 Units PRN  . atorvastatin (LIPITOR) 20 MG tablet Take 1 tablet (20 mg total) by mouth at bedtime. 90 tablet 3  . budesonide-formoterol (SYMBICORT) 160-4.5 MCG/ACT inhaler Inhale 2 puffs into the lungs in the morning and at bedtime. 1 each 0  . Empagliflozin-metFORMIN HCl (SYNJARDY) 12.12-998 MG TABS Take 1 tablet by mouth 2 (two) times daily. 60 tablet 2  . lansoprazole (PREVACID) 15 MG capsule Take 1 capsule (15 mg total) by mouth at bedtime. 90 capsule 1  . ALPRAZolam (XANAX) 0.5 MG tablet Take 1 tablet (0.5 mg total) by mouth daily as needed for anxiety or sleep. 30 tablet 1  . PARoxetine (PAXIL) 40 MG tablet Take 1 tablet (40 mg total) by mouth daily.  90 tablet 0  . liraglutide (VICTOZA) 18 MG/3ML SOPN Inject 1.8 mg into the skin daily. (Patient not taking: Reported on 01/31/2021) 27 mL 2   No facility-administered medications prior to visit.    Allergies  Allergen Reactions  . Amoxicillin Shortness Of Breath and Rash  . Codeine Other (See Comments)    Headache  . Vicodin [Hydrocodone-Acetaminophen]     ROS Review of Systems    Objective:    Physical Exam Constitutional:      Appearance: She is well-developed.  HENT:     Head: Normocephalic and atraumatic.     Right Ear: Tympanic membrane, ear canal and external ear normal.     Left Ear: Tympanic membrane, ear canal and external ear normal.     Nose: Nose normal.     Mouth/Throat:     Mouth: Mucous membranes are moist.     Pharynx: Oropharynx is clear. No oropharyngeal exudate or posterior oropharyngeal erythema.  Eyes:     Conjunctiva/sclera: Conjunctivae normal.     Pupils: Pupils are equal, round, and reactive to light.  Neck:     Thyroid: No thyromegaly.  Cardiovascular:     Rate and Rhythm: Normal rate and regular rhythm.     Heart sounds: Normal heart sounds.  Pulmonary:     Effort: Pulmonary effort is normal.     Breath sounds: Normal breath sounds. No wheezing.  Musculoskeletal:     Cervical back: Neck supple.  Lymphadenopathy:     Cervical: No cervical adenopathy.  Skin:    General: Skin is warm and dry.  Neurological:     Mental Status: She is alert and oriented to person, place, and time.  Psychiatric:        Behavior: Behavior normal.     BP (!) 156/71   Pulse 99   Temp 99.3 F (37.4 C)   Ht 5\' 9"  (1.753 m)   Wt 215 lb (97.5 kg)   LMP  (LMP Unknown)   SpO2 99%   BMI 31.75 kg/m  Wt Readings from Last 3 Encounters:  01/31/21 215 lb (97.5 kg)  01/17/21 215 lb (97.5 kg)  01/17/21 215 lb (97.5 kg)     There are no preventive care reminders to display for this patient.  There are no preventive care reminders to display for this  patient.  Lab Results  Component Value Date   TSH 3.80 03/08/2018   Lab Results  Component Value Date   WBC 10.0 01/17/2021   HGB 14.4 01/17/2021   HCT 44.4 01/17/2021  MCV 78.7 (L) 01/17/2021   PLT 439 (H) 01/17/2021   Lab Results  Component Value Date   NA 134 (L) 01/17/2021   K 4.3 01/17/2021   CO2 23 01/17/2021   GLUCOSE 188 (H) 01/17/2021   BUN 19 01/17/2021   CREATININE 1.15 (H) 01/17/2021   BILITOT 0.5 01/17/2021   ALKPHOS 84 01/17/2021   AST 22 01/17/2021   ALT 31 01/17/2021   PROT 7.9 01/17/2021   ALBUMIN 4.6 01/17/2021   CALCIUM 9.4 01/17/2021   ANIONGAP 13 01/17/2021   Lab Results  Component Value Date   CHOL 281 (H) 11/01/2020   Lab Results  Component Value Date   HDL 55 11/01/2020   Lab Results  Component Value Date   LDLCALC 184 (H) 11/01/2020   Lab Results  Component Value Date   TRIG 226 (H) 11/01/2020   Lab Results  Component Value Date   CHOLHDL 5.1 (H) 11/01/2020   Lab Results  Component Value Date   HGBA1C 8.6 (A) 01/31/2021      Assessment & Plan:   Problem List Items Addressed This Visit      Endocrine   Controlled type 2 diabetes mellitus without complication, without long-term current use of insulin ([place]) - Primary    Uncontrolled. Start Toujeo 10 units at night.  2 sample pens provided.  Our plan was to demonstrate how to use the pen but we did not have any of the pen needles.  We will need to make sure we send this over to the pharmacy.  Please go up by 2 units every 2 days until sugar in the morning is less than 130.        Relevant Medications   glucose blood test strip   glipiZIDE (GLUCOTROL) 5 MG tablet   Insulin Pen Needle 33G X 6 MM MISC   Other Relevant Orders   POCT glycosylated hemoglobin (Hb A1C) (Completed)     Other   Social anxiety disorder    Filled her Paxil and as needed use of alprazolam please use sparingly.      Relevant Medications   PARoxetine (PAXIL) 40 MG tablet   ALPRAZolam (XANAX) 0.5  MG tablet    Other Visit Diagnoses    Urinary frequency       Relevant Orders   POCT URINALYSIS DIP (CLINITEK) (Completed)     Urinary frequency-no sign of UTI on urinalysis most likely related to elevated glucose levels.  Acute sinusitis-we will go ahead and treat with with Omnicef.  Did recommend that she test for COVID as well just since she has had a possible exposure with her grandchildren.  Meds ordered this encounter  Medications  . PARoxetine (PAXIL) 40 MG tablet    Sig: Take 1 tablet (40 mg total) by mouth daily.    Dispense:  90 tablet    Refill:  1  . ALPRAZolam (XANAX) 0.5 MG tablet    Sig: Take 1 tablet (0.5 mg total) by mouth daily as needed for anxiety or sleep.    Dispense:  30 tablet    Refill:  1  . glucose blood test strip    Sig: For testing blood sugars up to 4 times daily. Dx E11.9    Dispense:  400 each    Refill:  12  . glipiZIDE (GLUCOTROL) 5 MG tablet    Sig: Take 1 tablet (5 mg total) by mouth 2 (two) times daily before a meal.    Dispense:  60 tablet  Refill:  3  . cefdinir (OMNICEF) 300 MG capsule    Sig: Take 1 capsule (300 mg total) by mouth 2 (two) times daily.    Dispense:  10 capsule    Refill:  0  . Insulin Pen Needle 33G X 6 MM MISC    Sig: Or similar needle size for Toujeo pen.    Dispense:  100 each    Refill:  0    Follow-up: Return in about 3 months (around 05/03/2021) for Diabetes follow-up.    Beatrice Lecher, MD

## 2021-01-31 NOTE — Assessment & Plan Note (Signed)
Filled her Paxil and as needed use of alprazolam please use sparingly.

## 2021-01-31 NOTE — Assessment & Plan Note (Addendum)
Uncontrolled. Start Toujeo 10 units at night.  2 sample pens provided.  Our plan was to demonstrate how to use the pen but we did not have any of the pen needles.  We will need to make sure we send this over to the pharmacy.  Please go up by 2 units every 2 days until sugar in the morning is less than 130.

## 2021-02-02 LAB — NOVEL CORONAVIRUS, NAA: SARS-CoV-2, NAA: NOT DETECTED

## 2021-02-02 LAB — SARS-COV-2, NAA 2 DAY TAT

## 2021-03-18 ENCOUNTER — Ambulatory Visit: Payer: 59 | Admitting: Family Medicine

## 2021-03-19 ENCOUNTER — Ambulatory Visit (INDEPENDENT_AMBULATORY_CARE_PROVIDER_SITE_OTHER): Payer: 59 | Admitting: Family Medicine

## 2021-03-19 ENCOUNTER — Encounter: Payer: Self-pay | Admitting: Family Medicine

## 2021-03-19 ENCOUNTER — Other Ambulatory Visit: Payer: Self-pay

## 2021-03-19 VITALS — BP 111/79 | HR 94 | Ht 69.0 in | Wt 213.0 lb

## 2021-03-19 DIAGNOSIS — E119 Type 2 diabetes mellitus without complications: Secondary | ICD-10-CM

## 2021-03-19 NOTE — Progress Notes (Signed)
Established Patient Office Visit  Subjective:  Patient ID: Jennifer Gray, female    DOB: 08-Feb-1966  Age: 55 y.o. MRN: [id number]  CC:  Chief Complaint  Patient presents with   Follow-up    Medication Refill Toujeo  on 16 units    HPI Jennifer Gray presents for   Diabetes - no hypoglycemic events. No wounds or sores that are not healing well. No increased thirst or urination. Checking glucose at home. Taking medications as prescribed without any side effects. Home sugars are running 170s.  She is feeling overwhelmed and frustrated at times especially with some of the food choices.  She said she had fried chicken, mac & cheese and bread 1 night and it really just got her sugar up to the point where she felt bad enough that she went to lay down.  Currently up to 16 units on the Toujeo.  She says she just feels tired all the time but she also has sleep apnea which she is not currently treating she was not all able to tolerate the CPAP.   Past Medical History:  Diagnosis Date   Anxiety    Anxiety    Diabetes mellitus without complication ([place])    Hypertension     Past Surgical History:  Procedure Laterality Date   BACK SURGERY     BREAST SURGERY      Family History  Problem Relation Age of Onset   Hypertension Mother    Cancer Father    Diabetes Other    Heart disease Brother 61    Social History   Socioeconomic History   Marital status: Married    Spouse name: Not on file   Number of children: Not on file   Years of education: Not on file   Highest education level: Not on file  Occupational History   Not on file  Tobacco Use   Smoking status: Former    Years: 10.00    Types: Cigarettes    Quit date: 09/17/2006    Years since quitting: 14.5   Smokeless tobacco: Never  Vaping Use   Vaping Use: Not on file  Substance and Sexual Activity   Alcohol use: No   Drug use: No   Sexual activity: Not on file  Other Topics Concern   Not on file  Social  History Narrative   Not on file   Social Determinants of Health   Financial Resource Strain: Not on file  Food Insecurity: Not on file  Transportation Needs: Not on file  Physical Activity: Not on file  Stress: Not on file  Social Connections: Not on file  Intimate Partner Violence: Not on file    Outpatient Medications Prior to Visit  Medication Sig Dispense Refill   ACCU-CHEK SOFTCLIX LANCETS lancets For testing once a day. Dx:E11.65 100 each 12   albuterol (VENTOLIN HFA) 108 (90 Base) MCG/ACT inhaler Inhale 2 puffs into the lungs every 6 (six) hours as needed for wheezing or shortness of breath. 8 g 0   ALPRAZolam (XANAX) 0.5 MG tablet Take 1 tablet (0.5 mg total) by mouth daily as needed for anxiety or sleep. 30 tablet 1   AMBULATORY NON FORMULARY MEDICATION Medication Name: glucometer, lancets and strips to test once a day. Dx. Diabetes type 2. 90 days supply 1 Units PRN   atorvastatin (LIPITOR) 20 MG tablet Take 1 tablet (20 mg total) by mouth at bedtime. 90 tablet 3   budesonide-formoterol (SYMBICORT) 160-4.5 MCG/ACT inhaler Inhale 2 puffs into the  lungs in the morning and at bedtime. 1 each 0   Empagliflozin-metFORMIN HCl (SYNJARDY) 12.12-998 MG TABS Take 1 tablet by mouth 2 (two) times daily. 60 tablet 2   glipiZIDE (GLUCOTROL) 5 MG tablet Take 1 tablet (5 mg total) by mouth 2 (two) times daily before a meal. 60 tablet 3   glucose blood test strip For testing blood sugars up to 4 times daily. Dx E11.9 400 each 12   Insulin Pen Needle 33G X 6 MM MISC Or similar needle size for Toujeo pen. 100 each 0   lansoprazole (PREVACID) 15 MG capsule Take 1 capsule (15 mg total) by mouth at bedtime. 90 capsule 1   PARoxetine (PAXIL) 40 MG tablet Take 1 tablet (40 mg total) by mouth daily. 90 tablet 1   cefdinir (OMNICEF) 300 MG capsule Take 1 capsule (300 mg total) by mouth 2 (two) times daily. 10 capsule 0   No facility-administered medications prior to visit.    Allergies  Allergen  Reactions   Amoxicillin Shortness Of Breath and Rash   Codeine Other (See Comments)    Headache   Vicodin [Hydrocodone-Acetaminophen]     ROS Review of Systems    Objective:    Physical Exam Constitutional:      Appearance: Normal appearance. She is well-developed.  HENT:     Head: Normocephalic and atraumatic.  Cardiovascular:     Rate and Rhythm: Normal rate and regular rhythm.     Heart sounds: Normal heart sounds.  Pulmonary:     Effort: Pulmonary effort is normal.     Breath sounds: Normal breath sounds.  Skin:    General: Skin is warm and dry.  Neurological:     Mental Status: She is alert and oriented to person, place, and time.  Psychiatric:        Behavior: Behavior normal.    BP 111/79   Pulse 94   Ht _0  (1.753 m)   Wt 213 lb (96.6 kg)   LMP  (LMP Unknown)   SpO2 99%   BMI 31.45 kg/m  Wt Readings from Last 3 Encounters:  03/19/21 213 lb (96.6 kg)  01/31/21 215 lb (97.5 kg)  01/17/21 215 lb (97.5 kg)     There are no preventive care reminders to display for this patient.  There are no preventive care reminders to display for this patient.  Lab Results  Component Value Date   TSH 3.80 03/08/2018   Lab Results  Component Value Date   WBC 10.0 01/17/2021   HGB 14.4 01/17/2021   HCT 44.4 01/17/2021   MCV 78.7 (L) 01/17/2021   PLT 439 (H) 01/17/2021   Lab Results  Component Value Date   NA 134 (L) 01/17/2021   K 4.3 01/17/2021   CO2 23 01/17/2021   GLUCOSE 188 (H) 01/17/2021   BUN 19 01/17/2021   CREATININE 1.15 (H) 01/17/2021   BILITOT 0.5 01/17/2021   ALKPHOS 84 01/17/2021   AST 22 01/17/2021   ALT 31 01/17/2021   PROT 7.9 01/17/2021   ALBUMIN 4.6 01/17/2021   CALCIUM 9.4 01/17/2021   ANIONGAP 13 01/17/2021   Lab Results  Component Value Date   CHOL 281 (H) 11/01/2020   Lab Results  Component Value Date   HDL 55 11/01/2020   Lab Results  Component Value Date   LDLCALC 184 (H) 11/01/2020   Lab Results  Component  Value Date   TRIG 226 (H) 11/01/2020   Lab Results  Component Value Date  CHOLHDL 5.1 (H) 11/01/2020   Lab Results  Component Value Date   HGBA1C 8.6 (A) 01/31/2021      Assessment & Plan:   Problem List Items Addressed This Visit       Endocrine   Controlled type 2 diabetes mellitus without complication, without long-term current use of insulin ([place]) - Primary    Last A1c was significantly elevated at 8.6 at 1 point she was really well controlled.  Right now she is doing well on the insulin she is up to 16 units but needs to continue to increase.  Go ahead and go up to 18 for the next for 5 days if blood sugars are not averaging less than 130 then continue to go up by 2 units every 4 to 5 days I will see her back in about 7 weeks and we will recheck the A1c at that time was spent a fair amount of time talking about diet and food choices and really continue to work on low-carb and low sugar intake.  Continuing to work on regular exercise and ramping that up I would really love to see her lose about 20 pounds as that would make a really big difference with her insulin resistance as well.        No orders of the defined types were placed in this encounter.   Follow-up: Return in about 7 weeks (around 05/06/2021) for Diabetes follow-up.    Beatrice Lecher, MD

## 2021-03-19 NOTE — Assessment & Plan Note (Signed)
Last A1c was significantly elevated at 8.6 at 1 point she was really well controlled.  Right now she is doing well on the insulin she is up to 16 units but needs to continue to increase.  Go ahead and go up to 18 for the next for 5 days if blood sugars are not averaging less than 130 then continue to go up by 2 units every 4 to 5 days I will see her back in about 7 weeks and we will recheck the A1c at that time was spent a fair amount of time talking about diet and food choices and really continue to work on low-carb and low sugar intake.  Continuing to work on regular exercise and ramping that up I would really love to see her lose about 20 pounds as that would make a really big difference with her insulin resistance as well.

## 2021-03-26 ENCOUNTER — Encounter: Payer: Self-pay | Admitting: Family Medicine

## 2021-03-26 DIAGNOSIS — F4010 Social phobia, unspecified: Secondary | ICD-10-CM

## 2021-03-26 DIAGNOSIS — E119 Type 2 diabetes mellitus without complications: Secondary | ICD-10-CM

## 2021-03-27 ENCOUNTER — Other Ambulatory Visit: Payer: Self-pay | Admitting: Family Medicine

## 2021-03-27 DIAGNOSIS — E119 Type 2 diabetes mellitus without complications: Secondary | ICD-10-CM

## 2021-03-27 NOTE — Telephone Encounter (Signed)
Amber, can you show Kenney Houseman how to approve some and then forward some of them.  Instead of just sending them all.

## 2021-03-28 ENCOUNTER — Encounter: Payer: Self-pay | Admitting: Family Medicine

## 2021-03-28 MED ORDER — ALBUTEROL SULFATE HFA 108 (90 BASE) MCG/ACT IN AERS: 2.0000 | INHALATION_SPRAY | Freq: Four times a day (QID) | RESPIRATORY_TRACT | 0 refills | Status: AC | PRN

## 2021-03-28 MED ORDER — TOUJEO MAX SOLOSTAR 300 UNIT/ML ~~LOC~~ SOPN: 16.0000 [IU] | PEN_INJECTOR | Freq: Every evening | SUBCUTANEOUS | 2 refills | Status: DC

## 2021-03-28 MED ORDER — BUDESONIDE-FORMOTEROL FUMARATE 160-4.5 MCG/ACT IN AERO: 2.0000 | INHALATION_SPRAY | Freq: Two times a day (BID) | RESPIRATORY_TRACT | 0 refills | Status: AC

## 2021-03-28 MED ORDER — LANSOPRAZOLE 15 MG PO CPDR
15.0000 mg | DELAYED_RELEASE_CAPSULE | Freq: Every day | ORAL | 1 refills | Status: AC
Start: 2021-03-28 — End: ?

## 2021-03-28 MED ORDER — ALPRAZOLAM 0.5 MG PO TABS: 0.5000 mg | ORAL_TABLET | Freq: Every day | ORAL | 1 refills | Status: AC | PRN

## 2021-03-28 MED ORDER — SYNJARDY 12.5-1000 MG PO TABS: 1.0000 | ORAL_TABLET | Freq: Two times a day (BID) | ORAL | 2 refills | Status: DC

## 2021-03-28 NOTE — Addendum Note (Signed)
Addended by: Beatrice Lecher D on: 03/28/2021 05:38 PM   Modules accepted: Orders

## 2021-03-28 NOTE — Telephone Encounter (Signed)
Sent new prescription for Toujeo.  If for some reason it is expensive and not covered by her formulary then please let us know.  Meds ordered this encounter  Medications   albuterol (VENTOLIN HFA) 108 (90 Base) MCG/ACT inhaler    Sig: Inhale 2 puffs into the lungs every 6 (six) hours as needed for wheezing or shortness of breath.    Dispense:  8 g    Refill:  0   ALPRAZolam (XANAX) 0.5 MG tablet    Sig: Take 1 tablet (0.5 mg total) by mouth daily as needed for anxiety or sleep.    Dispense:  30 tablet    Refill:  1   budesonide-formoterol (SYMBICORT) 160-4.5 MCG/ACT inhaler    Sig: Inhale 2 puffs into the lungs in the morning and at bedtime.    Dispense:  1 each    Refill:  0   Empagliflozin-metFORMIN HCl (SYNJARDY) 12.12-998 MG TABS    Sig: Take 1 tablet by mouth 2 (two) times daily.    Dispense:  60 tablet    Refill:  2    In place of Janumet   lansoprazole (PREVACID) 15 MG capsule    Sig: Take 1 capsule (15 mg total) by mouth at bedtime.    Dispense:  90 capsule    Refill:  1   insulin glargine, 2 Unit Dial, (TOUJEO MAX SOLOSTAR) 300 UNIT/ML Solostar Pen    Sig: Inject 16-24 Units into the skin at bedtime.    Dispense:  6 mL    Refill:  2

## 2021-04-04 ENCOUNTER — Other Ambulatory Visit: Payer: Self-pay

## 2021-04-04 DIAGNOSIS — E119 Type 2 diabetes mellitus without complications: Secondary | ICD-10-CM

## 2021-04-04 MED ORDER — INSULIN PEN NEEDLE 33G X 6 MM MISC: 0 refills | Status: AC

## 2021-04-18 ENCOUNTER — Other Ambulatory Visit: Payer: Self-pay

## 2021-04-18 ENCOUNTER — Emergency Department
Admission: EM | Admit: 2021-04-18 | Discharge: 2021-04-18 | Disposition: A | Discharge status: Home / Self Care | Payer: 59 | Source: Home / Self Care

## 2021-04-18 DIAGNOSIS — L03011 Cellulitis of right finger: Secondary | ICD-10-CM

## 2021-04-18 MED ORDER — SULFAMETHOXAZOLE-TRIMETHOPRIM 800-160 MG PO TABS: 1.0000 | ORAL_TABLET | Freq: Two times a day (BID) | ORAL | 0 refills | Status: AC

## 2021-04-18 NOTE — Discharge Instructions (Addendum)
Advised/instructed patient to take medication as directed with food to completion.  Encouraged patient increase daily water intake while taking this medication.

## 2021-04-18 NOTE — ED Triage Notes (Signed)
Pt presents with rt ring finger swelling and redness. No injury.

## 2021-04-18 NOTE — ED Provider Notes (Signed)
[facility]    CSN: [id number] Arrival date & time: 04/18/21  1556      History   Chief Complaint Chief Complaint  Patient presents with   finger infection    HPI Jennifer Gray is a 55 y.o. female.   HPI 55 year old female presents with possible right ring finger infection.  Patient reports redness and swelling for 3 days, denies injury or insult to this finger.  Past Medical History:  Diagnosis Date   Anxiety    Anxiety    Diabetes mellitus without complication ([place])    Hypertension     Patient Active Problem List   Diagnosis Date Noted   Malaise and fatigue 01/17/2021   GERD (gastroesophageal reflux disease) 09/13/2020   OSA (obstructive sleep apnea) 06/10/2019   Fatty liver 05/04/2019   Acne 09/17/2018   Controlled type 2 diabetes mellitus without complication, without long-term current use of insulin ([place]) 05/26/2016   Chronic idiopathic constipation 02/13/2015   Social anxiety disorder 08/01/2013   Radiculitis of left cervical region 09/30/2012    Past Surgical History:  Procedure Laterality Date   BACK SURGERY     BREAST SURGERY      OB History   No obstetric history on file.      Home Medications    Prior to Admission medications   Medication Sig Start Date End Date Taking? Authorizing Provider  sulfamethoxazole-trimethoprim (BACTRIM DS) 800-160 MG tablet Take 1 tablet by mouth 2 (two) times daily for 7 days. 04/18/21 04/25/21 Yes Eliezer Lofts, FNP  ACCU-CHEK SOFTCLIX LANCETS lancets For testing once a day. Dx:E11.65 04/08/18   Hali Marry, MD  albuterol (VENTOLIN HFA) 108 (90 Base) MCG/ACT inhaler Inhale 2 puffs into the lungs every 6 (six) hours as needed for wheezing or shortness of breath. 03/28/21   Hali Marry, MD  ALPRAZolam Duanne Moron) 0.5 MG tablet Take 1 tablet (0.5 mg total) by mouth daily as needed for anxiety or sleep. 03/28/21   Hali Marry, MD  AMBULATORY NON FORMULARY MEDICATION Medication Name:  glucometer, lancets and strips to test once a day. Dx. Diabetes type 2. 90 days supply 05/26/16   Hali Marry, MD  atorvastatin (LIPITOR) 20 MG tablet Take 1 tablet (20 mg total) by mouth at bedtime. 11/12/20   Hali Marry, MD  budesonide-formoterol (SYMBICORT) 160-4.5 MCG/ACT inhaler Inhale 2 puffs into the lungs in the morning and at bedtime. 03/28/21   Hali Marry, MD  Empagliflozin-metFORMIN HCl (SYNJARDY) 12.12-998 MG TABS Take 1 tablet by mouth 2 (two) times daily. 03/28/21   Hali Marry, MD  glipiZIDE (GLUCOTROL) 5 MG tablet Take 1 tablet (5 mg total) by mouth 2 (two) times daily before a meal. Patient not taking: Reported on 04/18/2021 01/31/21   Hali Marry, MD  glucose blood test strip For testing blood sugars up to 4 times daily. Dx E11.9 01/31/21   Hali Marry, MD  insulin glargine, 2 Unit Dial, (TOUJEO MAX SOLOSTAR) 300 UNIT/ML Solostar Pen Inject 16-24 Units into the skin at bedtime. 03/28/21   Hali Marry, MD  Insulin Pen Needle 33G X 6 MM MISC Or similar needle size for Toujeo pen. 04/04/21   Hali Marry, MD  lansoprazole (PREVACID) 15 MG capsule Take 1 capsule (15 mg total) by mouth at bedtime. 03/28/21   Hali Marry, MD  PARoxetine (PAXIL) 40 MG tablet Take 1 tablet (40 mg total) by mouth daily. 01/31/21   Hali Marry, MD  Family History Family History  Problem Relation Age of Onset   Hypertension Mother    Cancer Father    Diabetes Other    Heart disease Brother 61    Social History Social History   Tobacco Use   Smoking status: Former    Years: 10.00    Types: Cigarettes    Quit date: 09/17/2006    Years since quitting: 14.5   Smokeless tobacco: Never  Substance Use Topics   Alcohol use: No   Drug use: No     Allergies   Amoxicillin, Codeine, and Vicodin [hydrocodone-acetaminophen]   Review of Systems Review of Systems  Skin:  Positive for rash.  All other systems  reviewed and are negative.   Physical Exam Triage Vital Signs ED Triage Vitals  Enc Vitals Group     BP      Pulse      Resp      Temp      Temp src      SpO2      Weight      Height      Head Circumference      Peak Flow      Pain Score      Pain Loc      Pain Edu?      Excl. in [place]?    No data found.  Updated Vital Signs BP (!) 144/89 (BP Location: Left Arm)   Pulse (!) 103   Temp 98.7 F (37.1 C) (Oral)   Resp 14   Ht '5\' 9"'$  (1.753 m)   Wt 215 lb (97.5 kg)   LMP  (LMP Unknown)   SpO2 97%   BMI 31.75 kg/m      Physical Exam Vitals and nursing note reviewed.  Constitutional:      General: She is not in acute distress.    Appearance: Normal appearance. She is obese. She is not ill-appearing.  HENT:     Head: Normocephalic and atraumatic.     Mouth/Throat:     Mouth: Mucous membranes are moist.     Pharynx: Oropharynx is clear.  Eyes:     Extraocular Movements: Extraocular movements intact.     Conjunctiva/sclera: Conjunctivae normal.     Pupils: Pupils are equal, round, and reactive to light.  Cardiovascular:     Rate and Rhythm: Normal rate and regular rhythm.     Pulses: Normal pulses.     Heart sounds: Normal heart sounds.  Pulmonary:     Effort: Pulmonary effort is normal.     Breath sounds: Normal breath sounds. No wheezing, rhonchi or rales.  Musculoskeletal:        General: Normal range of motion.     Cervical back: Normal range of motion and neck supple. Tenderness present.  Lymphadenopathy:     Cervical: No cervical adenopathy.  Skin:    General: Skin is warm and dry.     Comments: Right ring finger (dorsal aspect of DIP/medial nail plate border): Erythematous, with soft tissue swelling, indurated, fluctuant, no discharge or drainage  Neurological:     General: No focal deficit present.     Mental Status: She is alert and oriented to person, place, and time. Mental status is at baseline.  Psychiatric:        Mood and Affect: Mood normal.         Behavior: Behavior normal.        Thought Content: Thought content normal.     UC Treatments /  Results  Labs (all labs ordered are listed, but only abnormal results are displayed) Labs Reviewed - No data to display  EKG   Radiology No results found.  Procedures Procedures (including critical care time)  Medications Ordered in UC Medications - No data to display  Initial Impression / Assessment and Plan / UC Course  I have reviewed the triage vital signs and the nursing notes.  Pertinent labs & imaging results that were available during my care of the patient were reviewed by me and considered in my medical decision making (see chart for details).     MDM: 1. Paronychia of right ring finger-Rx'd Bactrim. Advised/instructed patient to take medication as directed with food to completion.  Encouraged patient increase daily water intake while taking this medication.  Discharged home, hemodynamically stable. Final Clinical Impressions(s) / UC Diagnoses   Final diagnoses:  Paronychia of right ring finger     Discharge Instructions      Advised/instructed patient to take medication as directed with food to completion.  Encouraged patient increase daily water intake while taking this medication.     ED Prescriptions     Medication Sig Dispense Auth. Provider   sulfamethoxazole-trimethoprim (BACTRIM DS) 800-160 MG tablet Take 1 tablet by mouth 2 (two) times daily for 7 days. 14 tablet Eliezer Lofts, FNP      PDMP not reviewed this encounter.   Eliezer Lofts, [place] 04/18/21 1636

## 2021-05-04 ENCOUNTER — Other Ambulatory Visit: Payer: Self-pay | Admitting: Family Medicine

## 2021-05-07 ENCOUNTER — Ambulatory Visit (INDEPENDENT_AMBULATORY_CARE_PROVIDER_SITE_OTHER): Payer: 59 | Admitting: Family Medicine

## 2021-05-07 ENCOUNTER — Encounter: Payer: Self-pay | Admitting: Family Medicine

## 2021-05-07 VITALS — BP 136/82 | HR 99 | Ht 69.0 in | Wt 211.1 lb

## 2021-05-07 DIAGNOSIS — Z23 Encounter for immunization: Secondary | ICD-10-CM

## 2021-05-07 DIAGNOSIS — E119 Type 2 diabetes mellitus without complications: Secondary | ICD-10-CM

## 2021-05-07 DIAGNOSIS — L03011 Cellulitis of right finger: Secondary | ICD-10-CM | POA: Diagnosis not present

## 2021-05-07 LAB — POCT GLYCOSYLATED HEMOGLOBIN (HGB A1C): Hemoglobin A1C: 7.4 % — AB (ref 4.0–5.6)

## 2021-05-07 MED ORDER — TOUJEO MAX SOLOSTAR 300 UNIT/ML ~~LOC~~ SOPN: 28.0000 [IU] | PEN_INJECTOR | Freq: Every evening | SUBCUTANEOUS | 3 refills | Status: AC

## 2021-05-07 MED ORDER — SYNJARDY 12.5-1000 MG PO TABS: 1.0000 | ORAL_TABLET | Freq: Two times a day (BID) | ORAL | 1 refills | Status: AC

## 2021-05-07 NOTE — Progress Notes (Signed)
Established Patient Office Visit  Subjective:  Patient ID: Jennifer Gray, female    DOB: [birth year]  Age: 55 y.o. MRN: [id number]  CC: No chief complaint on file.   HPI Jennifer Gray presents for   Diabetes - no hypoglycemic events. No wounds or sores that are not healing well. No increased thirst or urination. Checking glucose at home. Taking medications as prescribed without any side effects. She is on [place] -she relates she was only taking it once a day.  She has not been taking the glipizide.  She is up to 28 units a day on the Toujeo and has been doing really well with the injection she is also really made some major changes to her diet but she still struggling with drinking diet [facility] 3 times a day she really feels like if she could kick that habit she would do even better.  She is down a couple pounds since when she was here last time in July.  She also went to urgent care for apparent paronychia on her third and fourth fingers on her right hand she had been going through a lot of things in her mom's house after she passed away.  She says that the fourth finger still looks a little bit red but it is no longer painful and just wanted to make sure it looked okay.   Past Medical History:  Diagnosis Date   Anxiety    Anxiety    Diabetes mellitus without complication ([place])    Hypertension     Past Surgical History:  Procedure Laterality Date   BACK SURGERY     BREAST SURGERY      Family History  Problem Relation Age of Onset   Hypertension Mother    Cancer Father    Diabetes Other    Heart disease Brother 23    Social History   Socioeconomic History   Marital status: Married    Spouse name: Not on file   Number of children: Not on file   Years of education: Not on file   Highest education level: Not on file  Occupational History   Not on file  Tobacco Use   Smoking status: Former    Years: 10.00    Types: Cigarettes    Quit date: 09/17/2006     Years since quitting: 14.6   Smokeless tobacco: Never  Vaping Use   Vaping Use: Not on file  Substance and Sexual Activity   Alcohol use: No   Drug use: No   Sexual activity: Not on file  Other Topics Concern   Not on file  Social History Narrative   Not on file   Social Determinants of Health   Financial Resource Strain: Not on file  Food Insecurity: Not on file  Transportation Needs: Not on file  Physical Activity: Not on file  Stress: Not on file  Social Connections: Not on file  Intimate Partner Violence: Not on file    Outpatient Medications Prior to Visit  Medication Sig Dispense Refill   ACCU-CHEK SOFTCLIX LANCETS lancets For testing once a day. Dx:E11.65 100 each 12   albuterol (VENTOLIN HFA) 108 (90 Base) MCG/ACT inhaler Inhale 2 puffs into the lungs every 6 (six) hours as needed for wheezing or shortness of breath. 8 g 0   ALPRAZolam (XANAX) 0.5 MG tablet Take 1 tablet (0.5 mg total) by mouth daily as needed for anxiety or sleep. 30 tablet 1   AMBULATORY NON FORMULARY MEDICATION Medication Name: glucometer,  lancets and strips to test once a day. Dx. Diabetes type 2. 90 days supply 1 Units PRN   atorvastatin (LIPITOR) 20 MG tablet Take 1 tablet (20 mg total) by mouth at bedtime. 90 tablet 3   budesonide-formoterol (SYMBICORT) 160-4.5 MCG/ACT inhaler Inhale 2 puffs into the lungs in the morning and at bedtime. 1 each 0   glucose blood test strip For testing blood sugars up to 4 times daily. Dx E11.9 400 each 12   Insulin Pen Needle 33G X 6 MM MISC Or similar needle size for Toujeo pen. 100 each 0   lansoprazole (PREVACID) 15 MG capsule Take 1 capsule (15 mg total) by mouth at bedtime. 90 capsule 1   PARoxetine (PAXIL) 40 MG tablet Take 1 tablet (40 mg total) by mouth daily. 90 tablet 1   Empagliflozin-metFORMIN HCl (SYNJARDY) 12.12-998 MG TABS Take 1 tablet by mouth 2 (two) times daily. 60 tablet 2   glipiZIDE (GLUCOTROL) 5 MG tablet Take 1 tablet (5 mg total) by mouth 2  (two) times daily before a meal. (Patient not taking: Reported on 04/18/2021) 60 tablet 3   insulin glargine, 2 Unit Dial, (TOUJEO MAX SOLOSTAR) 300 UNIT/ML Solostar Pen Inject 16-24 Units into the skin at bedtime. 6 mL 2   No facility-administered medications prior to visit.    Allergies  Allergen Reactions   Amoxicillin Shortness Of Breath and Rash   Codeine Other (See Comments)    Headache   Vicodin [Hydrocodone-Acetaminophen]     ROS Review of Systems    Objective:    Physical Exam Constitutional:      Appearance: Normal appearance. She is well-developed.  HENT:     Head: Normocephalic and atraumatic.  Cardiovascular:     Rate and Rhythm: Normal rate and regular rhythm.     Heart sounds: Normal heart sounds.  Pulmonary:     Effort: Pulmonary effort is normal.     Breath sounds: Normal breath sounds.  Skin:    General: Skin is warm and dry.  Neurological:     Mental Status: She is alert and oriented to person, place, and time.  Psychiatric:        Behavior: Behavior normal.    BP 136/82   Pulse 99   Ht '5\' 9"'$  (1.753 m)   Wt 211 lb 1.9 oz (95.8 kg)   LMP  (LMP Unknown)   SpO2 99%   BMI 31.18 kg/m  Wt Readings from Last 3 Encounters:  05/07/21 211 lb 1.9 oz (95.8 kg)  04/18/21 215 lb (97.5 kg)  03/19/21 213 lb (96.6 kg)     Health Maintenance Due  Topic Date Due   MAMMOGRAM  05/02/2021    There are no preventive care reminders to display for this patient.  Lab Results  Component Value Date   TSH 3.80 03/08/2018   Lab Results  Component Value Date   WBC 10.0 01/17/2021   HGB 14.4 01/17/2021   HCT 44.4 01/17/2021   MCV 78.7 (L) 01/17/2021   PLT 439 (H) 01/17/2021   Lab Results  Component Value Date   NA 134 (L) 01/17/2021   K 4.3 01/17/2021   CO2 23 01/17/2021   GLUCOSE 188 (H) 01/17/2021   BUN 19 01/17/2021   CREATININE 1.15 (H) 01/17/2021   BILITOT 0.5 01/17/2021   ALKPHOS 84 01/17/2021   AST 22 01/17/2021   ALT 31 01/17/2021   PROT  7.9 01/17/2021   ALBUMIN 4.6 01/17/2021   CALCIUM 9.4 01/17/2021   ANIONGAP  13 01/17/2021   Lab Results  Component Value Date   CHOL 281 (H) 11/01/2020   Lab Results  Component Value Date   HDL 55 11/01/2020   Lab Results  Component Value Date   LDLCALC 184 (H) 11/01/2020   Lab Results  Component Value Date   TRIG 226 (H) 11/01/2020   Lab Results  Component Value Date   CHOLHDL 5.1 (H) 11/01/2020   Lab Results  Component Value Date   HGBA1C 8.6 (A) 01/31/2021      Assessment & Plan:   Problem List Items Addressed This Visit       Endocrine   Controlled type 2 diabetes mellitus without complication, without long-term current use of insulin (HCC) - Primary      A1C down to 7.4. Take the Synjardy twice a day. Will remove glipizide from med list as she is not taking it currently. I had planned on stopping it anyway.   Monitor for about 10 days.  Blood sugars are still in the 140s then okay to increase Toujeo to 30 units daily.      Relevant Medications   insulin glargine, 2 Unit Dial, (TOUJEO MAX SOLOSTAR) 300 UNIT/ML Solostar Pen   Empagliflozin-metFORMIN HCl (SYNJARDY) 12.12-998 MG TABS   Other Relevant Orders   POCT HgB A1C   Other Visit Diagnoses     Need for immunization against influenza       Relevant Orders   Flu Vaccine QUAD 18moIM (Fluarix, Fluzone & Alfiuria Quad PF) (Completed)   Paronychia of finger, right          F/U paronychia - She still has some mild erythema around the nail border on the 4th digit on the right hands.  Call if not resolving or if suddenly gets worse.    Meds ordered this encounter  Medications   insulin glargine, 2 Unit Dial, (TOUJEO MAX SOLOSTAR) 300 UNIT/ML Solostar Pen    Sig: Inject 28-34 Units into the skin at bedtime.    Dispense:  6 mL    Refill:  3   Empagliflozin-metFORMIN HCl (SYNJARDY) 12.12-998 MG TABS    Sig: Take 1 tablet by mouth 2 (two) times daily.    Dispense:  180 tablet    Refill:  1    Follow-up:  Return in about 3 months (around 08/06/2021) for Diabetes follow-up.    CBeatrice Lecher MD

## 2021-05-07 NOTE — Assessment & Plan Note (Addendum)
A1C down to 7.4. Take the Synjardy twice a day. Will remove glipizide from med list as she is not taking it currently. I had planned on stopping it anyway.   Monitor for about 10 days.  Blood sugars are still in the 140s then okay to increase Toujeo to 30 units daily.

## 2021-05-10 IMAGING — DX DG CHEST 1V PORT
1 series · 1 of 1 positions shown · non-contrast
Comparison: 05/19/2016

CLINICAL DATA: Chest tightness, COVID positive

EXAM:
PORTABLE CHEST 1 VIEW

[chest ap]
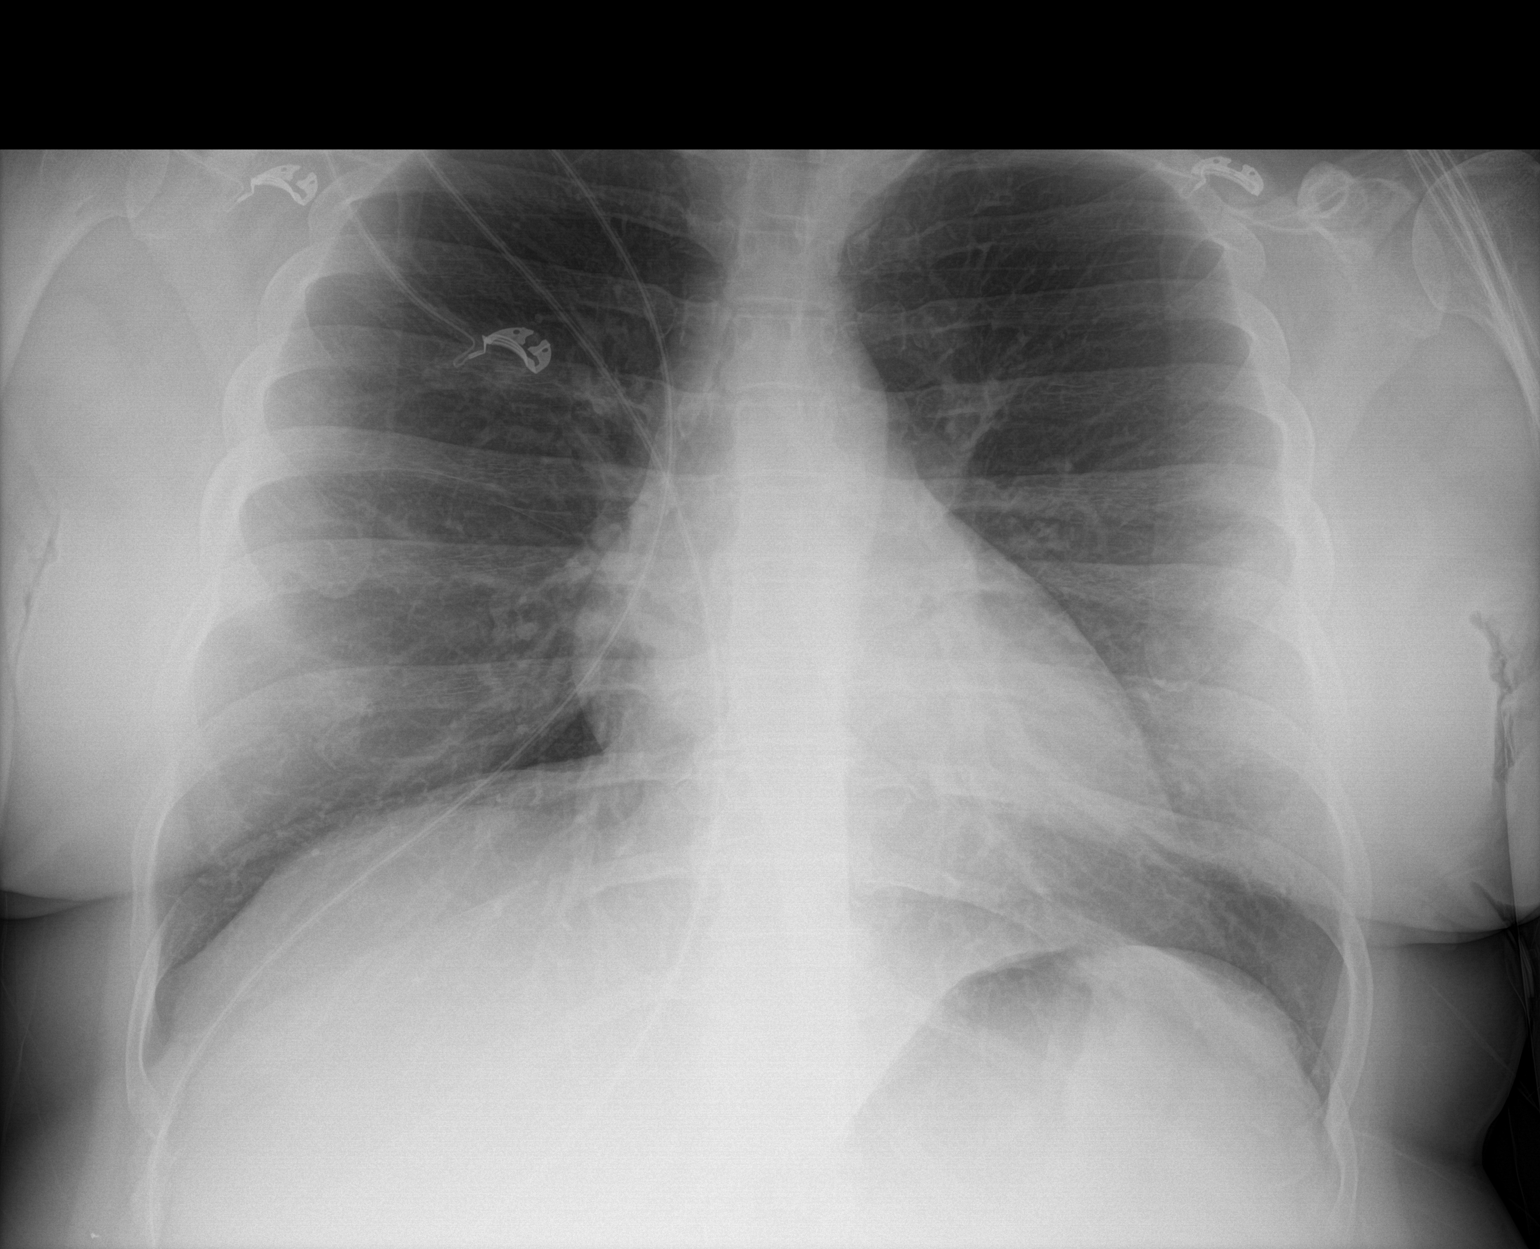

[1 of 1 positions shown; findings below may reference images not displayed]

FINDINGS: Lungs are clear.  No pleural effusion or pneumothorax.

The heart is normal in size.
IMPRESSION: No evidence of acute cardiopulmonary disease.

## 2021-05-15 ENCOUNTER — Telehealth: Payer: Self-pay | Admitting: *Deleted

## 2021-05-15 MED ORDER — PROMETHAZINE HCL 25 MG PO TABS: ORAL_TABLET | ORAL | 0 refills | Status: DC

## 2021-05-15 MED ORDER — SULFAMETHOXAZOLE-TRIMETHOPRIM 800-160 MG PO TABS: 1.0000 | ORAL_TABLET | Freq: Two times a day (BID) | ORAL | 0 refills | Status: DC

## 2021-05-15 NOTE — Telephone Encounter (Signed)
Pt called and stated that Dr. Madilyn Fireman told her to call about her infected fingernail. She stated that the nail has fallen off and she is asking for an antibiotic to be sent to her pharmacy for this.   See note from [place] on 9/6 below:  F/U paronychia - She still has some mild erythema around the nail border on the 4th digit on the right hands.  Call if not resolving or if suddenly gets worse.      [place].

## 2021-05-15 NOTE — Telephone Encounter (Signed)
Pt advised.

## 2021-05-15 NOTE — Telephone Encounter (Signed)
Meds ordered this encounter  Medications   sulfamethoxazole-trimethoprim (BACTRIM DS) 800-160 MG tablet    Sig: Take 1 tablet by mouth 2 (two) times daily.    Dispense:  14 tablet    Refill:  0

## 2021-05-28 ENCOUNTER — Encounter: Payer: Self-pay | Admitting: Family Medicine

## 2021-05-28 ENCOUNTER — Telehealth (INDEPENDENT_AMBULATORY_CARE_PROVIDER_SITE_OTHER): Payer: 59 | Admitting: Family Medicine

## 2021-05-28 VITALS — Ht 69.0 in

## 2021-05-28 DIAGNOSIS — J029 Acute pharyngitis, unspecified: Secondary | ICD-10-CM | POA: Diagnosis not present

## 2021-05-28 DIAGNOSIS — J039 Acute tonsillitis, unspecified: Secondary | ICD-10-CM

## 2021-05-28 MED ORDER — AZITHROMYCIN 250 MG PO TABS: ORAL_TABLET | ORAL | 0 refills | Status: AC

## 2021-05-28 NOTE — Progress Notes (Signed)
Sore throat x 1 wk she said that it is just her tonsils at the back of her throat.she has noticed some white spots and its really red, hurts to swallow.   Denies any f/s/c. She has had slight frontal headache, no facial pain/pressure.   She has tried tylenol and mucinex neither have helped with this.   She did a home covid test last night this was negative.

## 2021-05-28 NOTE — Progress Notes (Signed)
Virtual Visit via Video Note  I connected with Jennifer Gray on 05/28/21 at  2:20 PM EDT by a video enabled telemedicine application and verified that I am speaking with the correct person using two identifiers.   I discussed the limitations of evaluation and management by telemedicine and the availability of in person appointments. The patient expressed understanding and agreed to proceed.  Patient location: at home Provider location: in office  Subjective:    CC: ST  HPI:  Sore throat x 1 wk she said that it is just her tonsils at the back of her throat.she has noticed some white spots and its really red, hurts to swallow. Tylenol helps Denies any f/s/c. She has had slight frontal headache, no facial pain/pressure. No cough. Mild swollen LNs. No fever.  She has tried tylenol and mucinex neither have helped with this.    She did a home covid test last night this was negative.     Past medical history, Surgical history, Family history not pertinant except as noted below, Social history, Allergies, and medications have been entered into the medical record, reviewed, and corrections made.    Objective:    General: Speaking clearly in complete sentences without any shortness of breath.  Alert and oriented x3.  Normal judgment. No apparent acute distress.    Impression and Recommendations:    No problem-specific Assessment & Plan notes found for this encounter.  Pharyngitis -  possible strep vx viral infection x 1 weeks.  Keep continue salt water gargles and lozenges.  Call if not better in 5-7 days.     No orders of the defined types were placed in this encounter.   Meds ordered this encounter  Medications   azithromycin (ZITHROMAX) 250 MG tablet    Sig: 2 Ttabs PO on Day 1, then one a day x 4 days.    Dispense:  6 tablet    Refill:  0      I discussed the assessment and treatment plan with the patient. The patient was provided an opportunity to ask questions and  all were answered. The patient agreed with the plan and demonstrated an understanding of the instructions.   The patient was advised to call back or seek an in-person evaluation if the symptoms worsen or if the condition fails to improve as anticipated.   Beatrice Lecher, MD

## 2021-07-31 ENCOUNTER — Other Ambulatory Visit: Payer: Self-pay | Admitting: Osteopathic Medicine

## 2021-08-09 ENCOUNTER — Ambulatory Visit (INDEPENDENT_AMBULATORY_CARE_PROVIDER_SITE_OTHER): Payer: 59 | Admitting: Family Medicine

## 2021-08-09 ENCOUNTER — Other Ambulatory Visit: Payer: Self-pay

## 2021-08-09 VITALS — BP 146/90 | HR 96 | Resp 16 | Wt 218.0 lb

## 2021-08-09 DIAGNOSIS — Z1211 Encounter for screening for malignant neoplasm of colon: Secondary | ICD-10-CM

## 2021-08-09 DIAGNOSIS — F4010 Social phobia, unspecified: Secondary | ICD-10-CM

## 2021-08-09 DIAGNOSIS — K2100 Gastro-esophageal reflux disease with esophagitis, without bleeding: Secondary | ICD-10-CM

## 2021-08-09 DIAGNOSIS — R21 Rash and other nonspecific skin eruption: Secondary | ICD-10-CM

## 2021-08-09 DIAGNOSIS — E119 Type 2 diabetes mellitus without complications: Secondary | ICD-10-CM

## 2021-08-09 DIAGNOSIS — Z1231 Encounter for screening mammogram for malignant neoplasm of breast: Secondary | ICD-10-CM

## 2021-08-09 LAB — POCT GLYCOSYLATED HEMOGLOBIN (HGB A1C): HbA1c, POC (controlled diabetic range): 7.1 % — AB (ref 0.0–7.0)

## 2021-08-09 MED ORDER — PROMETHAZINE HCL 25 MG PO TABS: ORAL_TABLET | ORAL | 1 refills | Status: AC

## 2021-08-09 MED ORDER — CLOTRIMAZOLE-BETAMETHASONE 1-0.05 % EX CREA: 1.0000 "application " | TOPICAL_CREAM | Freq: Every day | CUTANEOUS | 0 refills | Status: AC | PRN

## 2021-08-09 NOTE — Assessment & Plan Note (Signed)
Doing well on Paxil.  Continue current regimen.

## 2021-08-09 NOTE — Assessment & Plan Note (Addendum)
A1c is 7.1 today which is improved from 7.4.  The had a discussion about setting some goals and just continue to work on healthy food choices and regular exercise.  Discussed goal of getting her A1c under 7.  She will need to follow-up with a new PCP in the spring but I am happy to send over refills in the interim if she needs it.

## 2021-08-09 NOTE — Progress Notes (Signed)
Established Patient Office Visit  Subjective:  Patient ID: Jennifer Gray, female    DOB: 18-Dec-1965  Age: 55 y.o. MRN: [id number]  CC:  Chief Complaint  Patient presents with   Diabetes    Follow up    Hand Pain    Right ring finger recheck infection.    Rash    On face for 3 weeks. Has tried Neosporin on face with no improvement    Medication Refill    Phenergan     HPI Jennifer Gray presents for follow-up for diabetes.  Because of her health insurance she will be switching plans in January and it is not excepted at our location so she will be transitioning to a new PCP in the spring.  Diabetes - no hypoglycemic events. No wounds or sores that are not healing well. No increased thirst or urination. Checking glucose at home. Taking medications as prescribed without any side effects.  Rash in her face and using Neosporin.  Has been using Neosporin on it.  It is mostly on the facial cheeks she says she noticed it seemed to break out more when she would wear make-up, which she usually only does about once a week.  Says it does seem a little bit smoother than it was it was initially more red and rough.  She feels like overall her mood is okay and she is doing well.  Questing refills on her Phenergan.   Past Medical History:  Diagnosis Date   Anxiety    Anxiety    Diabetes mellitus without complication ([place])    Hypertension     Past Surgical History:  Procedure Laterality Date   BACK SURGERY     BREAST SURGERY      Family History  Problem Relation Age of Onset   Hypertension Mother    Cancer Father    Diabetes Other    Heart disease Brother 21    Social History   Socioeconomic History   Marital status: Married    Spouse name: Not on file   Number of children: Not on file   Years of education: Not on file   Highest education level: Not on file  Occupational History   Not on file  Tobacco Use   Smoking status: Former    Years: 10.00    Types:  Cigarettes    Quit date: 09/17/2006    Years since quitting: 14.9   Smokeless tobacco: Never  Vaping Use   Vaping Use: Not on file  Substance and Sexual Activity   Alcohol use: No   Drug use: No   Sexual activity: Not on file  Other Topics Concern   Not on file  Social History Narrative   Not on file   Social Determinants of Health   Financial Resource Strain: Not on file  Food Insecurity: Not on file  Transportation Needs: Not on file  Physical Activity: Not on file  Stress: Not on file  Social Connections: Not on file  Intimate Partner Violence: Not on file    Outpatient Medications Prior to Visit  Medication Sig Dispense Refill   ACCU-CHEK SOFTCLIX LANCETS lancets For testing once a day. Dx:E11.65 100 each 12   albuterol (VENTOLIN HFA) 108 (90 Base) MCG/ACT inhaler Inhale 2 puffs into the lungs every 6 (six) hours as needed for wheezing or shortness of breath. 8 g 0   ALPRAZolam (XANAX) 0.5 MG tablet Take 1 tablet (0.5 mg total) by mouth daily as needed for anxiety or sleep.  30 tablet 1   AMBULATORY NON FORMULARY MEDICATION Medication Name: glucometer, lancets and strips to test once a day. Dx. Diabetes type 2. 90 days supply 1 Units PRN   atorvastatin (LIPITOR) 20 MG tablet Take 1 tablet (20 mg total) by mouth at bedtime. 90 tablet 3   budesonide-formoterol (SYMBICORT) 160-4.5 MCG/ACT inhaler Inhale 2 puffs into the lungs in the morning and at bedtime. 1 each 0   Empagliflozin-metFORMIN HCl (SYNJARDY) 12.12-998 MG TABS Take 1 tablet by mouth 2 (two) times daily. 180 tablet 1   glucose blood test strip For testing blood sugars up to 4 times daily. Dx E11.9 400 each 12   insulin glargine, 2 Unit Dial, (TOUJEO MAX SOLOSTAR) 300 UNIT/ML Solostar Pen Inject 28-34 Units into the skin at bedtime. 6 mL 3   Insulin Pen Needle 33G X 6 MM MISC Or similar needle size for Toujeo pen. 100 each 0   lansoprazole (PREVACID) 15 MG capsule Take 1 capsule (15 mg total) by mouth at bedtime. 90  capsule 1   PARoxetine (PAXIL) 40 MG tablet Take 1 tablet by mouth once daily 90 tablet 0   promethazine (PHENERGAN) 25 MG tablet TAKE 1 TABLET(25 MG) BY MOUTH EVERY 8 HOURS AS NEEDED FOR NAUSEA OR VOMITING 5 tablet 0   No facility-administered medications prior to visit.    Allergies  Allergen Reactions   Amoxicillin Shortness Of Breath and Rash   Codeine Other (See Comments)    Headache   Vicodin [Hydrocodone-Acetaminophen]     ROS Review of Systems    Objective:    Physical Exam Constitutional:      Appearance: Normal appearance. She is well-developed.  HENT:     Head: Normocephalic and atraumatic.  Cardiovascular:     Rate and Rhythm: Normal rate and regular rhythm.     Heart sounds: Normal heart sounds.  Pulmonary:     Effort: Pulmonary effort is normal.     Breath sounds: Normal breath sounds.  Skin:    General: Skin is warm and dry.  Neurological:     Mental Status: She is alert and oriented to person, place, and time.  Psychiatric:        Behavior: Behavior normal.   BP (!) 146/90   Pulse 96   Resp 16   Wt 218 lb 0.6 oz (98.9 kg)   LMP  (LMP Unknown)   BMI 32.20 kg/m  Wt Readings from Last 3 Encounters:  08/09/21 218 lb 0.6 oz (98.9 kg)  05/07/21 211 lb 1.9 oz (95.8 kg)  04/18/21 215 lb (97.5 kg)     There are no preventive care reminders to display for this patient.   There are no preventive care reminders to display for this patient.  Lab Results  Component Value Date   TSH 3.80 03/08/2018   Lab Results  Component Value Date   WBC 10.0 01/17/2021   HGB 14.4 01/17/2021   HCT 44.4 01/17/2021   MCV 78.7 (L) 01/17/2021   PLT 439 (H) 01/17/2021   Lab Results  Component Value Date   NA 134 (L) 01/17/2021   K 4.3 01/17/2021   CO2 23 01/17/2021   GLUCOSE 188 (H) 01/17/2021   BUN 19 01/17/2021   CREATININE 1.15 (H) 01/17/2021   BILITOT 0.5 01/17/2021   ALKPHOS 84 01/17/2021   AST 22 01/17/2021   ALT 31 01/17/2021   PROT 7.9 01/17/2021    ALBUMIN 4.6 01/17/2021   CALCIUM 9.4 01/17/2021   ANIONGAP 13 01/17/2021  Lab Results  Component Value Date   CHOL 281 (H) 11/01/2020   Lab Results  Component Value Date   HDL 55 11/01/2020   Lab Results  Component Value Date   LDLCALC 184 (H) 11/01/2020   Lab Results  Component Value Date   TRIG 226 (H) 11/01/2020   Lab Results  Component Value Date   CHOLHDL 5.1 (H) 11/01/2020   Lab Results  Component Value Date   HGBA1C 7.1 (A) 08/09/2021      Assessment & Plan:   Problem List Items Addressed This Visit       Digestive   GERD (gastroesophageal reflux disease) - Primary   Relevant Medications   promethazine (PHENERGAN) 25 MG tablet     Endocrine   Controlled type 2 diabetes mellitus without complication, without long-term current use of insulin ([facility])    A1c is 7.1 today which is improved from 7.4.  The had a discussion about setting some goals and just continue to work on healthy food choices and regular exercise.  Discussed goal of getting her A1c under 7.  She will need to follow-up with a new PCP in the spring but I am happy to send over refills in the interim if she needs it.      Relevant Orders   POCT HgB A1C (Completed)     Other   Social anxiety disorder    Doing well on Paxil.  Continue current regimen.      Other Visit Diagnoses     Colon cancer screening       Relevant Orders   Cologuard   Rash of face       Relevant Medications   clotrimazole-betamethasone (LOTRISONE) cream   Screening mammogram for breast cancer       Relevant Orders   MM 3D SCREEN BREAST BILATERAL       Rash-sounds like it could be a mild contact dermatitis from the make-up or maybe seborrheic dermatitis.  We will try a trial of Lotrisone cream and discussed cleaning her brushes and make-up applicators etc.  Encouraged her schedule her mammogram.  Colon cancer screening she would like to do Cologuard.  Order placed today.  Meds ordered this encounter   Medications   promethazine (PHENERGAN) 25 MG tablet    Sig: TAKE 1 TABLET(25 MG) BY MOUTH EVERY 8 HOURS AS NEEDED FOR NAUSEA OR VOMITING    Dispense:  10 tablet    Refill:  1   clotrimazole-betamethasone (LOTRISONE) cream    Sig: Apply 1 application topically daily as needed.    Dispense:  30 g    Refill:  0    Follow-up: Return in about 3 months (around 11/11/2021) for Diabetes follow-up.    Beatrice Lecher, MD

## 2021-12-16 ENCOUNTER — Other Ambulatory Visit: Payer: Self-pay | Admitting: Family Medicine

## 2022-07-26 LAB — COLOGUARD: COLOGUARD: POSITIVE — AB

## 2022-07-28 NOTE — Progress Notes (Signed)
Hi Jennifer Gray, Cologuard was positive which means we need to get you referred for a full colonoscopy.  This does not mean that you have colon cancer but does mean that you are at increased risk and that it needs to be evaluated further.  If you have a preference for provider location please let me know.
